# Patient Record
Sex: Male | Born: 1945 | Race: White | Hispanic: No | Marital: Married | Smoking: Never smoker
Health system: Southern US, Community
[De-identification: ages and names within clinical notes are randomized; demographics above are authoritative.]

## PROBLEM LIST (undated history)

## (undated) DIAGNOSIS — K5790 Diverticulosis of intestine, part unspecified, without perforation or abscess without bleeding: Secondary | ICD-10-CM

## (undated) DIAGNOSIS — J449 Chronic obstructive pulmonary disease, unspecified: Secondary | ICD-10-CM

## (undated) DIAGNOSIS — I10 Essential (primary) hypertension: Secondary | ICD-10-CM

## (undated) DIAGNOSIS — E785 Hyperlipidemia, unspecified: Secondary | ICD-10-CM

## (undated) DIAGNOSIS — K219 Gastro-esophageal reflux disease without esophagitis: Secondary | ICD-10-CM

## (undated) DIAGNOSIS — F419 Anxiety disorder, unspecified: Secondary | ICD-10-CM

## (undated) DIAGNOSIS — F329 Major depressive disorder, single episode, unspecified: Secondary | ICD-10-CM

## (undated) DIAGNOSIS — K2270 Barrett's esophagus without dysplasia: Secondary | ICD-10-CM

## (undated) DIAGNOSIS — F32A Depression, unspecified: Secondary | ICD-10-CM

## (undated) HISTORY — DX: Major depressive disorder, single episode, unspecified: F32.9

## (undated) HISTORY — DX: Gastro-esophageal reflux disease without esophagitis: K21.9

## (undated) HISTORY — DX: Essential (primary) hypertension: I10

## (undated) HISTORY — DX: Anxiety disorder, unspecified: F41.9

## (undated) HISTORY — DX: Depression, unspecified: F32.A

## (undated) HISTORY — DX: Hyperlipidemia, unspecified: E78.5

## (undated) HISTORY — DX: Chronic obstructive pulmonary disease, unspecified: J44.9

## (undated) HISTORY — DX: Barrett's esophagus without dysplasia: K22.70

## (undated) HISTORY — DX: Diverticulosis of intestine, part unspecified, without perforation or abscess without bleeding: K57.90

---

## 1983-06-15 HISTORY — PX: NASAL SINUS SURGERY: SHX719

## 1986-06-14 HISTORY — PX: LUMBAR LAMINECTOMY: SHX95

## 1996-06-14 HISTORY — PX: KNEE ARTHROSCOPY: SUR90

## 1998-10-05 ENCOUNTER — Emergency Department (HOSPITAL_COMMUNITY): Admission: EM | Admit: 1998-10-05 | Discharge: 1998-10-05 | Payer: Self-pay | Admitting: Emergency Medicine

## 1999-03-17 ENCOUNTER — Other Ambulatory Visit: Admission: RE | Admit: 1999-03-17 | Discharge: 1999-03-17 | Payer: Self-pay | Admitting: Internal Medicine

## 2001-02-06 ENCOUNTER — Encounter
Admission: RE | Admit: 2001-02-06 | Discharge: 2001-02-06 | Payer: Self-pay | Admitting: Physical Medicine and Rehabilitation

## 2001-02-06 ENCOUNTER — Encounter: Payer: Self-pay | Admitting: Physical Medicine and Rehabilitation

## 2001-03-07 ENCOUNTER — Other Ambulatory Visit: Admission: RE | Admit: 2001-03-07 | Discharge: 2001-03-07 | Payer: Self-pay | Admitting: Urology

## 2001-04-27 ENCOUNTER — Encounter: Admission: RE | Admit: 2001-04-27 | Discharge: 2001-05-19 | Payer: Self-pay | Admitting: Orthopaedic Surgery

## 2001-06-12 ENCOUNTER — Encounter: Payer: Self-pay | Admitting: Orthopaedic Surgery

## 2001-06-14 HISTORY — PX: OTHER SURGICAL HISTORY: SHX169

## 2001-06-20 ENCOUNTER — Inpatient Hospital Stay (HOSPITAL_COMMUNITY): Admission: RE | Admit: 2001-06-20 | Discharge: 2001-06-25 | Payer: Self-pay | Admitting: Orthopaedic Surgery

## 2001-06-20 ENCOUNTER — Encounter: Payer: Self-pay | Admitting: Orthopaedic Surgery

## 2001-06-24 ENCOUNTER — Encounter: Payer: Self-pay | Admitting: Orthopaedic Surgery

## 2001-11-17 ENCOUNTER — Encounter: Admission: RE | Admit: 2001-11-17 | Discharge: 2002-02-15 | Payer: Self-pay

## 2002-01-09 ENCOUNTER — Emergency Department (HOSPITAL_COMMUNITY): Admission: EM | Admit: 2002-01-09 | Discharge: 2002-01-09 | Payer: Self-pay | Admitting: Emergency Medicine

## 2002-02-16 ENCOUNTER — Encounter: Admission: RE | Admit: 2002-02-16 | Discharge: 2002-03-13 | Payer: Self-pay

## 2002-03-15 ENCOUNTER — Encounter: Admission: RE | Admit: 2002-03-15 | Discharge: 2002-06-13 | Payer: Self-pay

## 2002-08-20 ENCOUNTER — Encounter: Admission: RE | Admit: 2002-08-20 | Discharge: 2002-11-18 | Payer: Self-pay

## 2003-05-01 ENCOUNTER — Emergency Department (HOSPITAL_COMMUNITY): Admission: EM | Admit: 2003-05-01 | Discharge: 2003-05-01 | Payer: Self-pay | Admitting: *Deleted

## 2004-08-18 ENCOUNTER — Ambulatory Visit: Payer: Self-pay | Admitting: Internal Medicine

## 2004-08-19 ENCOUNTER — Ambulatory Visit: Payer: Self-pay | Admitting: Internal Medicine

## 2004-10-21 ENCOUNTER — Ambulatory Visit (HOSPITAL_COMMUNITY): Payer: Self-pay | Admitting: Psychiatry

## 2004-11-23 ENCOUNTER — Ambulatory Visit (HOSPITAL_COMMUNITY): Payer: Self-pay | Admitting: Psychiatry

## 2004-11-23 ENCOUNTER — Ambulatory Visit: Payer: Self-pay | Admitting: Psychiatry

## 2005-01-04 ENCOUNTER — Ambulatory Visit (HOSPITAL_COMMUNITY): Payer: Self-pay | Admitting: Psychiatry

## 2005-01-27 ENCOUNTER — Ambulatory Visit (HOSPITAL_COMMUNITY): Payer: Self-pay | Admitting: Psychiatry

## 2005-02-08 ENCOUNTER — Ambulatory Visit (HOSPITAL_COMMUNITY): Payer: Self-pay | Admitting: Psychiatry

## 2006-03-30 ENCOUNTER — Encounter: Admission: RE | Admit: 2006-03-30 | Discharge: 2006-03-30 | Payer: Self-pay | Admitting: Family Medicine

## 2006-08-19 ENCOUNTER — Ambulatory Visit: Payer: Self-pay | Admitting: Oncology

## 2009-02-26 ENCOUNTER — Encounter (INDEPENDENT_AMBULATORY_CARE_PROVIDER_SITE_OTHER): Payer: Self-pay | Admitting: *Deleted

## 2009-10-31 ENCOUNTER — Telehealth: Payer: Self-pay | Admitting: Internal Medicine

## 2010-07-14 NOTE — Progress Notes (Signed)
Summary: Schedule Colonoscopy  Phone Note Outgoing Call Call back at Home Phone [phone]   Call placed by: Harlow Mares CMA Duncan Dull),  Oct 31, 2009 3:29 PM Call placed to: Patient Summary of Call: spoke to the patient and he just had prostate surgery and he would like someone to mail him a letter in a couple of months to remind him to call back to schedule. we wil mail the pt a reminder letter Initial call taken by: Harlow Mares CMA Duncan Dull),  Oct 31, 2009 3:38 PM

## 2011-11-25 ENCOUNTER — Encounter: Payer: Self-pay | Admitting: Internal Medicine

## 2011-12-24 ENCOUNTER — Encounter: Payer: Self-pay | Admitting: Internal Medicine

## 2011-12-31 ENCOUNTER — Encounter: Payer: Self-pay | Admitting: Internal Medicine

## 2011-12-31 ENCOUNTER — Ambulatory Visit (AMBULATORY_SURGERY_CENTER): Payer: Medicare HMO | Admitting: *Deleted

## 2011-12-31 VITALS — Ht 68.0 in | Wt 208.8 lb

## 2011-12-31 DIAGNOSIS — Z1211 Encounter for screening for malignant neoplasm of colon: Secondary | ICD-10-CM

## 2011-12-31 MED ORDER — MOVIPREP 100 G PO SOLR: ORAL | Status: DC

## 2011-12-31 NOTE — Progress Notes (Signed)
Uses oxygen 2 liters at bedtime

## 2012-01-12 ENCOUNTER — Ambulatory Visit (AMBULATORY_SURGERY_CENTER): Payer: Medicare HMO | Admitting: Internal Medicine

## 2012-01-12 ENCOUNTER — Encounter: Payer: Self-pay | Admitting: Internal Medicine

## 2012-01-12 VITALS — BP 141/83 | HR 67 | Temp 97.0°F | Resp 15 | Ht 68.0 in | Wt 208.0 lb

## 2012-01-12 DIAGNOSIS — Z1211 Encounter for screening for malignant neoplasm of colon: Secondary | ICD-10-CM

## 2012-01-12 MED ORDER — SODIUM CHLORIDE 0.9 % IV SOLN: 500.0000 mL | INTRAVENOUS | Status: DC

## 2012-01-12 NOTE — Patient Instructions (Addendum)
YOU HAD AN ENDOSCOPIC PROCEDURE TODAY AT THE [facility]: Refer to the procedure report that was given to you for any specific questions about what was found during the examination.  If the procedure report does not answer your questions, please call your gastroenterologist to clarify.  If you requested that your care partner not be given the details of your procedure findings, then the procedure report has been included in a sealed envelope for you to review at your convenience later.  YOU SHOULD EXPECT: Some feelings of bloating in the abdomen. Passage of more gas than usual.  Walking can help get rid of the air that was put into your GI tract during the procedure and reduce the bloating. If you had a lower endoscopy (such as a colonoscopy or flexible sigmoidoscopy) you may notice spotting of blood in your stool or on the toilet paper. If you underwent a bowel prep for your procedure, then you may not have a normal bowel movement for a few days.  DIET: Your first meal following the procedure should be a light meal and then it is ok to progress to your normal diet.  A half-sandwich or bowl of soup is an example of a good first meal.  Heavy or fried foods are harder to digest and may make you feel nauseous or bloated.  Likewise meals heavy in dairy and vegetables can cause extra gas to form and this can also increase the bloating.  Drink plenty of fluids but you should avoid alcoholic beverages for 24 hours.  ACTIVITY: Your care partner should take you home directly after the procedure.  You should plan to take it easy, moving slowly for the rest of the day.  You can resume normal activity the day after the procedure however you should NOT DRIVE or use heavy machinery for 24 hours (because of the sedation medicines used during the test).    SYMPTOMS TO REPORT IMMEDIATELY: A gastroenterologist can be reached at any hour.  During normal business hours, 8:30 AM to 5:00 PM Monday through Friday,  call [phone].  After hours and on weekends, please call the GI answering service at [phone] who will take a message and have the physician on call contact you.   Following lower endoscopy (colonoscopy or flexible sigmoidoscopy):  Excessive amounts of blood in the stool  Significant tenderness or worsening of abdominal pains  Swelling of the abdomen that is new, acute  Fever of 100F or higher  FOLLOW UP:.  Our staff will call the home number listed on your records the next business day following your procedure to check on you and address any questions or concerns that you may have at that time regarding the information given to you following your procedure. This is a courtesy call and so if there is no answer at the home number and we have not heard from you through the emergency physician on call, we will assume that you have returned to your regular daily activities without incident.  SIGNATURES/CONFIDENTIALITY: You and/or your care partner have signed paperwork which will be entered into your electronic medical record.  These signatures attest to the fact that that the information above on your After Visit Summary has been reviewed and is understood.  Full responsibility of the confidentiality of this discharge information lies with you and/or your care-partner.   Resume your normal medications  Follow up colonoscopy in 10 years

## 2012-01-12 NOTE — Progress Notes (Signed)
Patient did not experience any of the following events: a burn prior to discharge; a fall within the facility; wrong site/side/patient/procedure/implant event; or a hospital transfer or hospital admission upon discharge from the facility. (G8907) Patient did not have preoperative order for IV antibiotic SSI prophylaxis. (G8918)  

## 2012-01-12 NOTE — Op Note (Signed)
[facility] [address] [place], [place]  [address]  COLONOSCOPY PROCEDURE REPORT  PATIENT:  Don, Garcia  MR#:  [id number] BIRTHDATE:  19-Oct-1945, 66 yrs. old  GENDER:  male ENDOSCOPIST:  Hedwig Morton. Juanda Chance, MD REF. BY:  Burnell Blanks, M.D. PROCEDURE DATE:  01/12/2012 PROCEDURE:  Colonoscopy 19147 ASA CLASS:  Class II INDICATIONS:  colorectal cancer screening, average risk last colon 03/1999 Polyp- lymphoid aggregate MEDICATIONS:   MAC sedation, administered by CRNA, propofol (Diprivan) 80 mg  DESCRIPTION OF PROCEDURE:   After the risks and benefits and of the procedure were explained, informed consent was obtained. Digital rectal exam was performed and revealed no rectal masses. The LB CF-[id number] endoscope was introduced through the anus and advanced to the cecum, which was identified by both the appendix and ileocecal valve.  The quality of the prep was good, using MoviPrep.  The instrument was then slowly withdrawn as the colon was fully examined. <<PROCEDUREIMAGES>>  FINDINGS:  Scattered diverticula were found (see image1 and image2).  This was otherwise a normal examination of the colon (see image5, image4, and image3).   Retroflexed views in the rectum revealed no abnormalities.    The scope was then withdrawn from the patient and the procedure completed.  COMPLICATIONS:  None ENDOSCOPIC IMPRESSION: 1) Diverticula, scattered 2) Otherwise normal examination RECOMMENDATIONS: 1) High fiber diet.  REPEAT EXAM:  In 10 year(s) for.  ______________________________ Hedwig Morton. Juanda Chance, MD  CC:  n. eSIGNED:   Hedwig Morton. Tyran Huser at 01/12/2012 02:24 PM  Felix Pacini, [phone]

## 2012-01-13 ENCOUNTER — Telehealth: Payer: Self-pay

## 2012-01-13 NOTE — Telephone Encounter (Signed)
  Follow up Call-  Call back number 01/12/2012  Post procedure Call Back phone  # [phone]  Permission to leave phone message Yes     Patient questions:  Do you have a fever, pain , or abdominal swelling? no Pain Score  0 *  Have you tolerated food without any problems? yes  Have you been able to return to your normal activities? yes  Do you have any questions about your discharge instructions: Diet   no Medications  no Follow up visit  no  Do you have questions or concerns about your Care? no  Actions: * If pain score is 4 or above: No action needed, pain <4.

## 2013-02-16 ENCOUNTER — Encounter: Payer: Self-pay | Admitting: Internal Medicine

## 2013-02-16 ENCOUNTER — Ambulatory Visit (INDEPENDENT_AMBULATORY_CARE_PROVIDER_SITE_OTHER): Payer: Medicare Other | Admitting: Internal Medicine

## 2013-02-16 VITALS — BP 142/94 | HR 103 | Temp 98.0°F | Ht 68.0 in | Wt 216.0 lb

## 2013-02-16 DIAGNOSIS — Z23 Encounter for immunization: Secondary | ICD-10-CM

## 2013-02-16 DIAGNOSIS — R0602 Shortness of breath: Secondary | ICD-10-CM | POA: Insufficient documentation

## 2013-02-16 DIAGNOSIS — R000 Tachycardia, unspecified: Secondary | ICD-10-CM | POA: Insufficient documentation

## 2013-02-16 NOTE — Patient Instructions (Addendum)
No changes in medications for now  Please schedule a follow up office visit in 4 weeks, sooner if needed with pfts  Late add : use bud with duoneb at least twice daily and bring neb solutions back Late add : did not go for cxr or labs will do on return since symptoms present for years
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
## 2013-02-17 NOTE — Assessment & Plan Note (Addendum)
-   02/16/2013  Walked RA x 2 laps @ 185 ft each stopped due to sob and sat around 88% but pulse reading was erratic and pt did not appear tachypneic  Symptoms are markedly disproportionate to objective findings and not clear this is a lung problem but pt does appear to have difficult airway management issues. DDX of  difficult airways managment all start with A and  include Adherence, Ace Inhibitors, Acid Reflux, Active Sinus Disease, Alpha 1 Antitripsin deficiency, Anxiety masquerading as Airways dz,  ABPA,  allergy(esp in young), Aspiration (esp in elderly), Adverse effects of DPI,  Active smokers, plus two Bs  = Bronchiectasis and Beta blocker use..and one C= CHF  ? Acid reflux related > difficult to exclude due to contribution of non-acid gerd > max rx x one month rec  Anxiety > typically a dx of exclusion but much higher on ddx based on hx and exam  ? Allergies/ asthma > continue bud bid with duoneb for now pending f/u with pfts  ? chf unlikely s peripheral edema/ orthopnea or worse when walking than at rest.

## 2013-02-20 ENCOUNTER — Telehealth: Payer: Self-pay | Admitting: Internal Medicine

## 2013-02-20 NOTE — Telephone Encounter (Signed)
Spoke with pt  He states that MW had asked him at ov last wk if he coughed up any mucus, and he said no, but now he is calling to say that he does cough up mucus He states that this has been going on for a long time, and he is unsure if mucus has any color to it He also wants MW to know that since meds adjusted he is doing much better with his breathing I advised that I will let MW know, and if he has further recs I will call him back  Pt encouraged to keep ov with PFT for 03/20/13

## 2013-02-26 ENCOUNTER — Telehealth: Payer: Self-pay | Admitting: Internal Medicine

## 2013-02-26 NOTE — Telephone Encounter (Signed)
There is no point in trying to work this out over the phone as we did not accomplish anything at the last ov for the same problem, namely doing accurate med rec   Tell pt: see Tammy NP w/in 1 weeks with all your medications, even over the counter meds, separated in two separate bags, the ones you take no matter what vs the ones you stop once you feel better and take only as needed when you feel you need them.   Tammy  will generate for you a new user friendly medication calendar that will put [place] all on the same page re: your medication use.     Without this process, it simply isn't possible to assure that we are providing  your outpatient care  with  the attention to detail we feel you deserve.   If we cannot assure that you're getting that kind of care,  then we cannot manage your problem effectively from this clinic.  Once you have seen Tammy and we are sure that we're all on the same page with your medication use she will arrange follow up with me.  Tammy can do labs and xrays also

## 2013-02-26 NOTE — Telephone Encounter (Signed)
Spoke with patient-- Patient states he is SOB but no more SOB than when he was at last OV 02/16/13 Patient says his nebulizers are not working anymore, When asked which nebulizers he is currently taking he states budesonide in which he is taking BID Also stated he is taking another one that starts with a "L" (xopenex?) states he is taking this BID as well right after the budesonide According to last OV: Patient Instructions    No changes in medications for now  Please schedule a follow up office visit in 4 weeks, sooner if needed with pfts  Late add : use bud with duoneb at least twice daily and bring neb solutions back  Late add : did not go for cxr or labs will do on return since symptoms present for years   I do no see Duoneb on patients med list nor did patient mention this. I asked patient does he feel he needs to come in and be seen sooner than his scheduled f/u Per patient "I been breathing like this most of my life" Patient denies any chest tightness or wheezing Requesting recs per Dr. Sherene Sires-- please advise, thank you!  Last OV 02/16/13 Next OV 03/20/13 with PFTs same day  Allergies  Allergen Reactions  . Celebrex [Celecoxib]     lethargy  . Vioxx [Rofecoxib]     lethargy

## 2013-02-26 NOTE — Telephone Encounter (Signed)
Called, spoke with pt - Explained MW's recs to him.  Offered OV tomorrow with TP in [place].  Pt declined d/t [place] location.  Next available with TP for med cal is Monday, Sept 22.  Pt ok with this appt.  He is aware to arrive at 10:45 am and to bring ALL meds he is currently taking (OTC, rx, prn, vitamins, etc) to OV.  Advised med list would NOT work.  Pt verbalized understanding and voiced no further questions or concerns at this time.  ** Pt did clarify below med per conversation with Lauren and states the med that started with a "L" is levalbuterol.

## 2013-02-28 MED ORDER — AMOXICILLIN-POT CLAVULANATE 875-125 MG PO TABS: 1.0000 | ORAL_TABLET | Freq: Two times a day (BID) | ORAL | Status: DC

## 2013-02-28 NOTE — Telephone Encounter (Signed)
Pt calling again in ref to previous msg can be reached at [phone].Don Garcia

## 2013-02-28 NOTE — Telephone Encounter (Signed)
augmentin 875 bid x 10 days > then sinus ct if fails to clear it

## 2013-02-28 NOTE — Telephone Encounter (Signed)
Called, spoke with pt.  Informed him of below recs per Dr. Sherene Sires.  He is aware abx sent to [facility].  He is to call back if symptoms do not improve or worsen.  Pt has a pending Med Cal OV scheduled with TP on 03/05/13.  Encouraged pt to keep this appt and reminded him to bring all meds with him to OV.  He verbalized understanding and voiced no further questions or concerns at this time.

## 2013-02-28 NOTE — Telephone Encounter (Signed)
Called # listed to call in message and was advised they do not know anyone by that name. Confirmed #. I called # listed above.  I spoke with pt. He stated he is coughing up yellow-green phlem now. He is wheezing but no chest tx. Please advise Dr. Sherene Sires thanks

## 2013-03-05 ENCOUNTER — Encounter: Payer: Medicare Other | Admitting: Adult Health

## 2013-03-07 ENCOUNTER — Telehealth: Payer: Self-pay | Admitting: Internal Medicine

## 2013-03-07 NOTE — Telephone Encounter (Signed)
I spoke with pt. He reports since doing the nebulizer treatments as MW advised her has been feeling hoarse since the weekend. Hoarseness comes and goes. He is not due back until 03/20/13. He is wanting to know if the nebulizer are causing this or if he should do anything different. He is fine with a call back in the AM when MW comes into the office. Please advise MW thanks  Allergies  Allergen Reactions  . Celebrex [Celecoxib]     lethargy  . Vioxx [Rofecoxib]     lethargy

## 2013-03-07 NOTE — Telephone Encounter (Signed)
Called spoke with patient, advised of recs as stated by MW below.  Pt verbalized his understanding and will continue recommendations as discussed at last ov and call for sooner follow up if breathing worsens.  Nothing further needed at this time; will sign off.

## 2013-03-07 NOTE — Telephone Encounter (Signed)
If it's just the hoarseness would not change rx, but if breathing gets worse at all needs to move up ov

## 2013-03-20 ENCOUNTER — Encounter: Payer: Self-pay | Admitting: Internal Medicine

## 2013-03-20 ENCOUNTER — Ambulatory Visit (INDEPENDENT_AMBULATORY_CARE_PROVIDER_SITE_OTHER): Payer: Medicare Other | Admitting: Internal Medicine

## 2013-03-20 ENCOUNTER — Ambulatory Visit (INDEPENDENT_AMBULATORY_CARE_PROVIDER_SITE_OTHER)
Admission: RE | Admit: 2013-03-20 | Discharge: 2013-03-20 | Disposition: A | Discharge status: Home / Self Care | Payer: Medicare Other | Source: Ambulatory Visit | Attending: Internal Medicine | Admitting: Internal Medicine

## 2013-03-20 ENCOUNTER — Other Ambulatory Visit (INDEPENDENT_AMBULATORY_CARE_PROVIDER_SITE_OTHER): Payer: Medicare Other

## 2013-03-20 VITALS — BP 122/80 | HR 87 | Temp 98.0°F | Ht 68.0 in | Wt 213.0 lb

## 2013-03-20 DIAGNOSIS — R0602 Shortness of breath: Secondary | ICD-10-CM

## 2013-03-20 DIAGNOSIS — J449 Chronic obstructive pulmonary disease, unspecified: Secondary | ICD-10-CM

## 2013-03-20 DIAGNOSIS — R000 Tachycardia, unspecified: Secondary | ICD-10-CM

## 2013-03-20 LAB — CBC WITH DIFFERENTIAL/PLATELET
Basophils Relative: 0.5 % (ref 0.0–3.0)
Eosinophils Absolute: 0.1 10*3/uL (ref 0.0–0.7)
Hemoglobin: 14.1 g/dL (ref 13.0–17.0)
Lymphocytes Relative: 18.3 % (ref 12.0–46.0)
MCHC: 34.3 g/dL (ref 30.0–36.0)
MCV: 86.9 fl (ref 78.0–100.0)
Neutro Abs: 5.6 10*3/uL (ref 1.4–7.7)
Neutrophils Relative %: 72 % (ref 43.0–77.0)
RBC: 4.72 Mil/uL (ref 4.22–5.81)
RDW: 13.4 % (ref 11.5–14.6)

## 2013-03-20 LAB — BASIC METABOLIC PANEL
CO2: 28 mEq/L (ref 19–32)
GFR: 72.06 mL/min (ref >60.00)
Glucose, Bld: 110 mg/dL — ABNORMAL HIGH (ref 70–99)
Potassium: 4.1 mEq/L (ref 3.5–5.1)
Sodium: 141 mEq/L (ref 135–145)

## 2013-03-20 LAB — D-DIMER, QUANTITATIVE (NOT AT ARMC): D-Dimer, Quant: 0.27 ug/mL-FEU (ref 0.00–0.48)

## 2013-03-20 LAB — TSH: TSH: 2.5 u[IU]/mL (ref 0.35–5.50)
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
## 2013-03-20 NOTE — Progress Notes (Signed)
Quick Note:  Spoke with pt and notified of results per Dr. Wert. Pt verbalized understanding and denied any questions.  ______ 

## 2013-03-20 NOTE — Assessment & Plan Note (Signed)
-   02/16/2013  Walked RA x 2 laps @ 185 ft each stopped due to sob and sat around 88% but pulse reading was erratic and pt did not appear tachypneic - 03/20/2013   PFTs FEV1  3.29 (103%) ratio 81 and no change p B2  DLC0 66% and corrects to 92% - 03/20/2013  Walked RA x 3 laps @ 185 ft each stopped due to  End of study, no sob  Pt not convinced the meds are making any difference in his symptoms so it's fine with me to try reducing them and observing for worse symptoms but needs to be more insightful about those specific symptoms related to ? Asthma  and an action plan for prns in event those symptoms worsen as taper meds that may or may not be helping.  This is the reverse of a therapeutic trial and it a way more difficult for pts to follow than adding meds to list.  "stopping meds to see if it hurts"' rather than "starting meds to see if helps"  > stop all resp meds and see  Next step would be cpst with spirometry before and after but strongly suspect this is all anxiety/ obesity/ deconditioning.

## 2013-03-20 NOTE — Patient Instructions (Addendum)
You do not have significant copd and never will as long as you don't smoke.  Weight control is simply a matter of calorie balance which needs to be tilted in your favor by eating less and exercising more.  To get the most out of exercise, you need to be continuously aware that you are short of breath, but never out of breath, for 30 minutes daily. As you improve, it will actually be easier for you to do the same amount of exercise  in  30 minutes so always push to the level where you are short of breath.  If this does not result in gradual weight reduction then I strongly recommend you see a nutritionist with a food diary x 2 weeks so that we can work out a negative calorie balance which is universally effective in steady weight loss programs.  Think of your calorie balance like you do your bank account where in this case you want the balance to go down so you must take in less calories than you burn up.  It's just that simple:  Hard to do, but easy to understand.  Good luck!   Please remember to go to the lab and x-ray department downstairs for your tests - we will call you with the results when they are available.

## 2013-03-20 NOTE — Progress Notes (Signed)
PFT done today. 

## 2013-03-22 ENCOUNTER — Telehealth: Payer: Self-pay | Admitting: Internal Medicine

## 2013-03-22 DIAGNOSIS — R0602 Shortness of breath: Secondary | ICD-10-CM

## 2013-03-22 NOTE — Telephone Encounter (Signed)
Pt seen on 03-20-13.  SOB is no better since that visit.  PT denies wheezing, chest tightness, cough or fever.  Pt uses oxygen at hs only.  Pt has not used nebulizer since ov and sob is still the same.  Pt states that if he doesn't have COPD then what does he have and what does he need to do from here.  Please advise

## 2013-03-22 NOTE — Telephone Encounter (Signed)
PT informed of Dr Thurston Hole recommendations.HE verified understanding.   CPST order sent

## 2013-03-22 NOTE — Telephone Encounter (Signed)
Since breathing no worse with exertion rec walk 30 min daily at pace that he can tolerate  Schedule for 2 weeks from now a cpst with spirometry before and after and I'll call him with results

## 2013-03-26 ENCOUNTER — Telehealth: Payer: Self-pay | Admitting: Internal Medicine

## 2013-03-26 NOTE — Telephone Encounter (Signed)
I spoke with pt. He stated he is not going to walk daily bc he has a "walking problem". He will do what he can. This is FYI for [place].

## 2013-03-28 ENCOUNTER — Telehealth: Payer: Self-pay | Admitting: Internal Medicine

## 2013-03-28 NOTE — Telephone Encounter (Signed)
I spoke with pt. Documented on earlier phone note

## 2013-03-28 NOTE — Telephone Encounter (Signed)
Pt also called back to tell us he stopped all his medications prescribed by [place]. He stated he doesn't have COPD so he does not feel he needs them. He stopped the medications last week. He also reports it doesn't help with his breathing. He is not going to restart them either. Will forward to Dr. Sherene Sires

## 2013-03-28 NOTE — Telephone Encounter (Signed)
I spoke with pt. He stated he does not have good balance and does not walk good. He stated he is not sure if he is able to do the CPX d/t to this that is scheduled for 04/03/13. He reports since COPD was ruled out he does not know why he needs to have this done anyways. Please advise MW pt does not really want test done thanks

## 2013-03-29 ENCOUNTER — Telehealth: Payer: Self-pay | Admitting: Internal Medicine

## 2013-03-29 NOTE — Telephone Encounter (Signed)
Pt is already scheduled for CPST. He is stating he does not feel he can do this. He is wanting to cancel. Please advise thanks

## 2013-03-29 NOTE — Telephone Encounter (Signed)
Fine to cancel, nothing else to offer, he should return to primary care to regroup

## 2013-03-29 NOTE — Telephone Encounter (Signed)
Pt called back and is wanting to make sure it is okay for him to cancel the test. Please advise Dr. Sherene Sires thanks

## 2013-03-29 NOTE — Telephone Encounter (Signed)
Schedule cpst with spirometry before and after, next available

## 2013-03-29 NOTE — Telephone Encounter (Signed)
I spoke with pt and advised him we are awaiting MW response.

## 2013-03-29 NOTE — Telephone Encounter (Signed)
Duplicate message. 

## 2013-03-29 NOTE — Telephone Encounter (Signed)
I spoke with pt. Made him aware. Test has been cancelled. He will call to see his PCP for future.

## 2013-03-29 NOTE — Telephone Encounter (Signed)
Pt called back & asks to speak w/ MW's nurse.  Antionette Fairy

## 2013-04-02 ENCOUNTER — Telehealth: Payer: Self-pay | Admitting: Internal Medicine

## 2013-04-02 NOTE — Telephone Encounter (Signed)
I spoke with pt. He is requesting an appt to see MW instead. He is scheduled to come in at 8:45. Nothing further needed

## 2013-04-02 NOTE — Telephone Encounter (Signed)
Fine to reschedule cpst

## 2013-04-02 NOTE — Telephone Encounter (Signed)
Called, spoke with pt.  C/o SOB at rest and with exertion x "years."  States this is unchanged since last OV with MW.  Also has wheezing, some chest tightness, and coughing with green mucus at times.  No f/c/s.  Pt wanting to know why CPST was cancelled.  According to phone msg from 03/28/13, pt called requesting it be cancelled because he didn't feel he could do it d/t not being able to walk good and not having good balance. MW ok'ed it to be cancelled and rec pt return to PCP to regroup.  I reminded pt of this.  He verbalized understanding.  Pt states he would now like to try to proceed with the CPST.  Dr. Sherene Sires, are you ok with this now?  Please advise.  Thank you.  Note:  CPST was ordered for:  Needs CPST with spirometry before and after

## 2013-04-03 ENCOUNTER — Ambulatory Visit (INDEPENDENT_AMBULATORY_CARE_PROVIDER_SITE_OTHER): Payer: Medicare Other | Admitting: Internal Medicine

## 2013-04-03 ENCOUNTER — Encounter: Payer: Self-pay | Admitting: Internal Medicine

## 2013-04-03 ENCOUNTER — Encounter (HOSPITAL_COMMUNITY): Payer: Medicare Other

## 2013-04-03 VITALS — BP 144/90 | HR 90 | Temp 98.0°F | Ht 68.0 in | Wt 220.0 lb

## 2013-04-03 DIAGNOSIS — R0602 Shortness of breath: Secondary | ICD-10-CM
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
## 2013-04-03 NOTE — Patient Instructions (Addendum)
Weight control is simply a matter of calorie balance which needs to be tilted in your favor by eating less and exercising more.  To get the most out of exercise, you need to be continuously aware that you are short of breath, but never out of breath, for 30 minutes daily. As you improve, it will actually be easier for you to do the same amount of exercise  in  30 minutes so always push to the level where you are short of breath.  If this does not result in gradual weight reduction then I strongly recommend you see a nutritionist with a food diary x 2 weeks so that we can work out a negative calorie balance which is universally effective in steady weight loss programs.  Think of your calorie balance like you do your bank account where in this case you want the balance to go down so you must take in less calories than you burn up.  It's just that simple:  Hard to do, but easy to understand.  Good luck!   The next step in the work up is to do cpst (stress test) but there's no reason to attempt this unless you walk up to at least 20 min without stopping (if you need to walk slowly at first that's fine)  I suggest you return to Dr Nathanial Rancher and let her decide whether further specialty care or CPST are warranted.  No regular pulmonary follow up needed

## 2013-04-03 NOTE — Assessment & Plan Note (Signed)
-   02/16/2013  Walked RA x 2 laps @ 185 ft each stopped due to sob and sat around 88% but pulse reading was erratic and pt did not appear tachypneic - 03/20/2013   PFTs FEV1  3.29 (103%) ratio 81 and no change p B2  DLC0 66% and corrects to 92% - 03/20/2013  Walked RA x 3 laps @ 185 ft each stopped due to  End of study, no sob - 04/03/2013  Walked RA x 1 laps @ 185 ft each stopped due to sob s desat      I had an extended summary  discussion with the patient today lasting 15 to 20 minutes of a 25 minute visit on the following issues:  All indicators point to progressive wt gain/ deconditioning as cause of sob with ex and sigh breathing at rest c/w anxiety disorder.  He could not possibly benefit from cpst at this point because he's not walking long enough for eval and needs to start reconditioning ex today.  Referred back to Dr [facility] with f/u here prn

## 2013-04-04 ENCOUNTER — Telehealth: Payer: Self-pay | Admitting: Internal Medicine

## 2013-04-04 NOTE — Telephone Encounter (Signed)
I spoke with pt. He stated he still feels SOB. I advised him that MW stated to follow up with his PCP for further eval. I advised MW has did what he could for him since he had refused to do CPST. Pt then stopped me and stated "I know he has and he ain't worth a Sh**". Pt then hung the phone up on me. I will forward to MW as an Lorain Childes pt called and was rude.

## 2014-04-29 ENCOUNTER — Institutional Professional Consult (permissible substitution): Payer: Medicare Other | Admitting: Internal Medicine

## 2014-05-13 ENCOUNTER — Encounter: Payer: Self-pay | Admitting: Internal Medicine

## 2014-05-28 ENCOUNTER — Ambulatory Visit (HOSPITAL_BASED_OUTPATIENT_CLINIC_OR_DEPARTMENT_OTHER): Payer: Medicare Other | Attending: Allergy and Immunology | Admitting: Radiology

## 2014-05-28 VITALS — Ht 68.0 in | Wt 215.0 lb

## 2014-05-28 DIAGNOSIS — R0683 Snoring: Secondary | ICD-10-CM

## 2014-05-28 DIAGNOSIS — G4734 Idiopathic sleep related nonobstructive alveolar hypoventilation: Secondary | ICD-10-CM | POA: Insufficient documentation

## 2014-06-02 DIAGNOSIS — G4734 Idiopathic sleep related nonobstructive alveolar hypoventilation: Secondary | ICD-10-CM

## 2014-06-02 DIAGNOSIS — R0683 Snoring: Secondary | ICD-10-CM

## 2014-06-02 NOTE — Sleep Study (Signed)
   NAME: Don BarrierMichael L Yanda DATE OF BIRTH:  [birth year] MEDICAL RECORD NUMBER [id number]  LOCATION: [facility]  PHYSICIAN: Ramonica Grigg D  DATE OF STUDY: 05/28/2014  SLEEP STUDY TYPE: Nocturnal Polysomnogram               REFERRING PHYSICIAN: Jessica PriestKozlow, Eric J, MD  INDICATION FOR STUDY: Hypersomnia with sleep apnea  EPWORTH SLEEPINESS SCORE:   2/24 HEIGHT: 5\' 8"  (172.7 cm)  WEIGHT: 215 lb (97.523 kg)    Body mass index is 32.7 kg/(m^2).  NECK SIZE: 16 in.  MEDICATIONS: Charted for review  SLEEP ARCHITECTURE: Split study protocol. During the diagnostic phase, total sleep time 130.5 minutes with sleep efficiency 65.3%. Stage I was 25.3%, stage II 70.9%, stage III absent, REM 3.8% of total sleep time. Sleep latency 21.5 minutes, REM latency 69.5 minutes, awake after sleep onset 45 minutes, arousal index 28, bedtime medication: Sertraline, amlodipine, lorazepam, lovastatin  RESPIRATORY DATA: Apnea hypopnea index (AHI) 26.2 per hour. 57 total events scored including 17 obstructive apneas and 40 hypopneas. Most events were while nonsupine. REM AHI 0. CPAP titration to 11 CWP, AHI 0 per hour. He wore a medium fullface mask.  OXYGEN DATA: Very loud snoring before CPAP with oxygen desaturation to a nadir of 85% on room air. With CPAP control snoring was prevented and mean oxygen saturation was 91.2%.  CARDIAC DATA: Sinus rhythm with PACs and PVCs  MOVEMENT/PARASOMNIA: During the diagnostic phase total of 90 limb jerks were counted of which 6 were associated with arousal or awakening for an index of 2.8 per hour. During the titration phase total of 120 limb jerks were counted of which 2 were associated with arousal or awakening for a periodic limb movement with arousal index of 0.7 per hour. Bathroom 1  IMPRESSION/ RECOMMENDATION:   1) Moderate obstructive sleep apnea/hypopnea syndrome, AHI 26.2 per hour, REM AHI 0 per hour. Very loud snoring with oxygen desaturation to a  nadir of 85% on room air. 2) Successful CPAP titration to 11 CWP, AHI 0 per hour. He wore a medium [employer] Simplus fullface mask with heated humidifier. Snoring was prevented and mean oxygen saturation was 91.2%.   Waymon BudgeYOUNG,Avontae Burkhead D Diplomate, American Board of Sleep Medicine  ELECTRONICALLY SIGNED ON:  06/02/2014, 9:34 AM [facility] PH: [phone]   FX: [phone] ACCREDITED BY THE AMERICAN ACADEMY OF SLEEP MEDICINE

## 2014-12-17 ENCOUNTER — Encounter: Payer: Self-pay | Admitting: Internal Medicine

## 2015-01-14 ENCOUNTER — Telehealth: Payer: Self-pay | Admitting: Internal Medicine

## 2015-01-14 NOTE — Telephone Encounter (Signed)
Spoke with patient and offered to move up OV. He will call back to do this after he talks with his wife.

## 2015-01-14 NOTE — Telephone Encounter (Signed)
Moved OV to 01/17/15 at 1:45 PM.

## 2015-01-14 NOTE — Telephone Encounter (Signed)
Pt called and has another appt on 01/17/15 and would like appt moved to 01/24/15. Pts appt moved to 01/24/15@1 :15pm. Pt aware of appt.

## 2015-01-17 ENCOUNTER — Ambulatory Visit: Payer: Medicare Other | Admitting: Physician Assistant

## 2015-01-21 ENCOUNTER — Encounter: Payer: Self-pay | Admitting: *Deleted

## 2015-01-24 ENCOUNTER — Encounter: Payer: Self-pay | Admitting: Physician Assistant

## 2015-01-24 ENCOUNTER — Ambulatory Visit: Payer: Medicare Other | Admitting: Physician Assistant

## 2015-01-24 ENCOUNTER — Ambulatory Visit (INDEPENDENT_AMBULATORY_CARE_PROVIDER_SITE_OTHER): Payer: Medicare Other | Admitting: Physician Assistant

## 2015-01-24 VITALS — BP 132/82 | HR 68 | Ht 68.5 in | Wt 224.0 lb

## 2015-01-24 DIAGNOSIS — K219 Gastro-esophageal reflux disease without esophagitis: Secondary | ICD-10-CM

## 2015-01-24 DIAGNOSIS — K5900 Constipation, unspecified: Secondary | ICD-10-CM

## 2015-01-24 DIAGNOSIS — R1310 Dysphagia, unspecified: Secondary | ICD-10-CM | POA: Diagnosis not present

## 2015-01-24 MED ORDER — OMEPRAZOLE 40 MG PO CPDR: 40.0000 mg | DELAYED_RELEASE_CAPSULE | Freq: Two times a day (BID) | ORAL | Status: DC

## 2015-01-24 MED ORDER — POLYETHYLENE GLYCOL 3350 17 GM/SCOOP PO POWD: ORAL | Status: DC

## 2015-01-24 NOTE — Patient Instructions (Addendum)
You have been scheduled for an endoscopy. Please follow written instructions given to you at your visit today. If you use inhalers (even only as needed), please bring them with you on the day of your procedure. Your physician has requested that you go to www.startemmi.com and enter the access code given to you at your visit today. This web site gives a general overview about your procedure. However, you should still follow specific instructions given to you by our office regarding your preparation for the procedure.  We have sent the following medications to your pharmacy for you to pick up at your convenience: Omeprazole 40 mg twice daily Miralax- 2 capfuls daily  Please make certain to eat plenty of "P" fruits: prunes, peaches, pears, pineapple, plums  You have been scheduled for a Barium Esophogram at [facility] (1st floor of the hospital) on 01/27/15 at 10:00 am. Please arrive 30 minutes prior to your appointment for registration. Make certain not to have anything to eat or drink 6 hours prior to your test. If you need to reschedule for any reason, please contact radiology at [phone] to do so. __________________________________________________________________ A barium swallow is an examination that concentrates on views of the esophagus. This tends to be a double contrast exam (barium and two liquids which, when combined, create a gas to distend the wall of the oesophagus) or single contrast (non-ionic iodine based). The study is usually tailored to your symptoms so a good history is essential. Attention is paid during the study to the form, structure and configuration of the esophagus, looking for functional disorders (such as aspiration, dysphagia, achalasia, motility and reflux) EXAMINATION You may be asked to change into a gown, depending on the type of swallow being performed. A radiologist and radiographer will perform the procedure. The radiologist will advise you of the type  of contrast selected for your procedure and direct you during the exam. You will be asked to stand, sit or lie in several different positions and to hold a small amount of fluid in your mouth before being asked to swallow while the imaging is performed .In some instances you may be asked to swallow barium coated marshmallows to assess the motility of a solid food bolus. The exam can be recorded as a digital or video fluoroscopy procedure. POST PROCEDURE It will take 1-2 days for the barium to pass through your system. To facilitate this, it is important, unless otherwise directed, to increase your fluids for the next 24-48hrs and to resume your normal diet.  This test typically takes about 30 minutes to perform. __________________________________________________________________________________

## 2015-01-24 NOTE — Progress Notes (Signed)
Reviewed and agree.

## 2015-01-24 NOTE — Progress Notes (Signed)
Patient ID: Don Garcia, male   DOB: 1945-12-25, 69 y.o.   MRN: [id number]    HPI:  Don Garcia is a 69 y.o.   male referred by Hamrick, Durward Fortes, MD for evaluation of dysphagia. He is known to Dr. Juanda Chance from a colonoscopy in July 2013 at which time he was noted to have scattered diverticuli and was advised to have surveillance in 10 years. He has also had an EGD in March 2006 which was performed due to findings of a positive fecal occult blood test. He was noted to have some antral erythema with a negative CLOtest. He was treated with Nexium.  He presents today with a 6 month history of dysphagia to solids and liquids that has been becoming progressively more severe. He is unable to eat any solids without sipping water to push it down yet he then feels liquids get stuck and bulb blood. He does not feel as if liquids or pooling in his throat and he sputters, but says food builds up in his esophagus. He denies epigastric pain, nausea, or vomiting. His weight has been stable. He also reports that he has been fairly constipated and has been skipping 3 or 4 days between bowel movements. He has no bright red blood per rectum or melena. He admits that he does not like fruits or vegetables and does not drink a lot of fluids during the day. His past medical history is significant for GERD, Barrett's esophagus in the past. Anxiety, depression, hyperlipidemia, COPD, hypertension, and sleep apnea.    Past Medical History  Diagnosis Date  . Hypertension   . COPD (chronic obstructive pulmonary disease)   . Hyperlipidemia   . Depression   . Anxiety   . GERD (gastroesophageal reflux disease)   . Barrett esophagus   . Diverticulosis     Past Surgical History  Procedure Laterality Date  . Lumbar laminectomy  1988  . Back fusion  2003  . Knee arthroscopy  1998    bilateral  . Nasal sinus surgery  1985   Family History  Problem Relation Age of Onset  . Colon cancer Neg Hx   . Stomach cancer  Neg Hx   . Diabetes Paternal Uncle   . Diabetes Maternal Grandfather   . Renal cancer Paternal Uncle   . Lung disease Mother    Social History  Substance Use Topics  . Smoking status: Never Smoker   . Smokeless tobacco: Never Used  . Alcohol Use: No   Current Outpatient Prescriptions  Medication Sig Dispense Refill  . amLODipine (NORVASC) 5 MG tablet Take 5 mg by mouth daily.     Marland Kitchen aspirin 325 MG tablet Take 325 mg by mouth daily.    Marland Kitchen buPROPion (WELLBUTRIN XL) 300 MG 24 hr tablet Take 300 mg by mouth daily.    Marland Kitchen desmopressin (DDAVP) 0.2 MG tablet Take 0.2 mg by mouth 2 (two) times daily.    Marland Kitchen donepezil (ARICEPT) 10 MG tablet Take 10 mg by mouth at bedtime.    . hydrOXYzine (ATARAX/VISTARIL) 25 MG tablet Take 25 mg by mouth 3 (three) times daily as needed.    . lovastatin (MEVACOR) 40 MG tablet Take 20 mg by mouth at bedtime. Takes 2 tablets at bedtime    . meloxicam (MOBIC) 15 MG tablet Take 15 mg by mouth daily.    . metoprolol succinate (TOPROL-XL) 50 MG 24 hr tablet Take 50 mg by mouth daily. Take with or immediately following a meal.    .  montelukast (SINGULAIR) 10 MG tablet Take 10 mg by mouth at bedtime.    . Multiple Vitamin (MULTIVITAMIN) capsule Take 1 capsule by mouth daily.    Marland Kitchen OLANZapine (ZYPREXA) 2.5 MG tablet Take 2.5 mg by mouth at bedtime.    . ranitidine (ZANTAC) 300 MG tablet Take 300 mg by mouth at bedtime.    Marland Kitchen rOPINIRole (REQUIP) 1 MG tablet Take 1 mg by mouth 3 (three) times daily.    . sertraline (ZOLOFT) 100 MG tablet Take 100 mg by mouth 2 (two) times daily.     Marland Kitchen omeprazole (PRILOSEC) 40 MG capsule Take 1 capsule (40 mg total) by mouth 2 (two) times daily. 60 capsule 2  . polyethylene glycol powder (GLYCOLAX/MIRALAX) powder Dissolve 2 capfuls in at least 16 ounces water/juice and drink daily 1000 g 2   No current facility-administered medications for this visit.   Allergies  Allergen Reactions  . Celebrex [Celecoxib]     lethargy  . Vioxx [Rofecoxib]      lethargy     Review of Systems: Gen: Denies any fever, chills, sweats, anorexia, fatigue, weakness, malaise, weight loss, and sleep disorder CV: Denies chest pain, angina, palpitations, syncope, orthopnea, PND, peripheral edema, and claudication. Resp: Denies dyspnea at rest, dyspnea with exercise, cough, sputum, wheezing, coughing up blood, and pleurisy. GI: Denies vomiting blood, jaundice, and fecal incontinence.   Admits to dysphagia to solids and liquids GU : Denies urinary burning, blood in urine, urinary frequency, urinary hesitancy, nocturnal urination, and urinary incontinence. MS: Denies joint pain, limitation of movement, and swelling, stiffness, low back pain, extremity pain. Denies muscle weakness, cramps, atrophy.  Derm: Denies rash, itching, dry skin, hives, moles, warts, or unhealing ulcers.  Psych: Denies depression, anxiety, memory loss, suicidal ideation, hallucinations, paranoia, and confusion. Heme: Denies bruising, bleeding, and enlarged lymph nodes. Neuro:  Denies any headaches, dizziness, paresthesias. Endo:  Denies any problems with DM, thyroid, adrenal function    Prior Endoscopies:   See history of present illness  Physical Exam: BP 132/82 mmHg  Pulse 68  Ht 5' 8.5" (1.74 m)  Wt 224 lb (101.606 kg)  BMI 33.56 kg/m2 Constitutional: Pleasant,well-developed, elderly Caucasian male in no acute distress. HEENT: Normocephalic and atraumatic. Conjunctivae are normal. No scleral icterus. Neck supple. No adenopathy Cardiovascular: Normal rate, regular rhythm.  Pulmonary/chest: Effort normal and breath sounds normal. No wheezing, rales or rhonchi. Abdominal: Soft, nondistended, nontender. Bowel sounds active throughout. There are no masses palpable. No hepatomegaly. Extremities: no edema Lymphadenopathy: No cervical adenopathy noted. Neurological: Alert and oriented to person place and time. Skin: Skin is warm and dry. No rashes noted. Psychiatric: Normal mood  and affect. Behavior is normal.  ASSESSMENT AND PLAN: 69 year old male with a history of GERD presenting with 6 months of progressive dysphagia to solids and liquids. Patient will be scheduled for barium swallow with tablet to evaluate for possible stricture, esophageal wall thickening, etc. He will then be scheduled for an EGD with possible dilation.The risks, benefits, and alternatives to endoscopy with possible biopsy and possible dilation were discussed with the patient and they consent to proceed. In the meantime, he will increase his omeprazole 40 mg twice a day. The procedure will be scheduled with Dr. Adela Lank.  With regards to his constipation. He has been instructed to try to add "P fruits" to his diet and to increase water intake. He will also try Mira lax 2 capfuls daily.  Further recommendations will be made pending the findings of the above.  Trini Soldo, Tollie Pizza PA-C 01/24/2015, 2:50 PM  CC: Hamrick, Durward Fortes, MD

## 2015-01-27 ENCOUNTER — Ambulatory Visit (HOSPITAL_COMMUNITY)
Admission: RE | Admit: 2015-01-27 | Discharge: 2015-01-27 | Disposition: A | Discharge status: Home / Self Care | Payer: Medicare Other | Source: Ambulatory Visit | Attending: Physician Assistant | Admitting: Physician Assistant

## 2015-01-27 ENCOUNTER — Other Ambulatory Visit (HOSPITAL_COMMUNITY): Payer: Self-pay | Admitting: Internal Medicine

## 2015-01-27 ENCOUNTER — Other Ambulatory Visit: Payer: Self-pay | Admitting: *Deleted

## 2015-01-27 DIAGNOSIS — K219 Gastro-esophageal reflux disease without esophagitis: Secondary | ICD-10-CM | POA: Diagnosis not present

## 2015-01-27 DIAGNOSIS — R1310 Dysphagia, unspecified: Secondary | ICD-10-CM | POA: Diagnosis present

## 2015-01-27 DIAGNOSIS — K5900 Constipation, unspecified: Secondary | ICD-10-CM | POA: Insufficient documentation

## 2015-01-28 ENCOUNTER — Other Ambulatory Visit (HOSPITAL_COMMUNITY): Payer: Self-pay | Admitting: Physician Assistant

## 2015-01-28 DIAGNOSIS — R1314 Dysphagia, pharyngoesophageal phase: Secondary | ICD-10-CM

## 2015-01-29 ENCOUNTER — Ambulatory Visit (HOSPITAL_COMMUNITY): Payer: Medicare Other

## 2015-01-29 ENCOUNTER — Telehealth: Payer: Self-pay | Admitting: Internal Medicine

## 2015-01-29 NOTE — Telephone Encounter (Signed)
Spoke with patient and gave him the report from esophagram again.

## 2015-02-07 ENCOUNTER — Ambulatory Visit (HOSPITAL_COMMUNITY)
Admission: RE | Admit: 2015-02-07 | Discharge: 2015-02-07 | Disposition: A | Discharge status: Home / Self Care | Payer: Medicare Other | Source: Ambulatory Visit | Attending: Physician Assistant | Admitting: Physician Assistant

## 2015-02-07 ENCOUNTER — Inpatient Hospital Stay (HOSPITAL_COMMUNITY): Admission: RE | Admit: 2015-02-07 | Payer: Medicare Other | Source: Ambulatory Visit

## 2015-02-07 DIAGNOSIS — R1314 Dysphagia, pharyngoesophageal phase: Secondary | ICD-10-CM | POA: Insufficient documentation

## 2015-02-07 DIAGNOSIS — R1310 Dysphagia, unspecified: Secondary | ICD-10-CM

## 2015-02-10 ENCOUNTER — Ambulatory Visit (HOSPITAL_COMMUNITY): Payer: Medicare Other

## 2015-02-25 ENCOUNTER — Ambulatory Visit: Payer: Medicare Other | Admitting: Internal Medicine

## 2015-03-05 DIAGNOSIS — K219 Gastro-esophageal reflux disease without esophagitis: Secondary | ICD-10-CM | POA: Insufficient documentation

## 2015-03-05 DIAGNOSIS — J310 Chronic rhinitis: Secondary | ICD-10-CM | POA: Insufficient documentation

## 2015-03-05 DIAGNOSIS — J309 Allergic rhinitis, unspecified: Secondary | ICD-10-CM | POA: Insufficient documentation

## 2015-03-05 DIAGNOSIS — J45909 Unspecified asthma, uncomplicated: Secondary | ICD-10-CM | POA: Insufficient documentation

## 2015-03-21 ENCOUNTER — Encounter: Payer: Medicare Other | Admitting: Gastroenterology

## 2015-06-11 ENCOUNTER — Emergency Department (HOSPITAL_COMMUNITY): Payer: Medicare Other

## 2015-06-11 ENCOUNTER — Encounter (HOSPITAL_COMMUNITY): Payer: Self-pay | Admitting: *Deleted

## 2015-06-11 ENCOUNTER — Inpatient Hospital Stay (HOSPITAL_COMMUNITY)
Admission: EM | Admit: 2015-06-11 | Discharge: 2015-06-13 | DRG: 056 | Disposition: A | Discharge status: Hospice | Payer: Medicare Other | Attending: Internal Medicine | Admitting: Internal Medicine

## 2015-06-11 DIAGNOSIS — Z981 Arthrodesis status: Secondary | ICD-10-CM

## 2015-06-11 DIAGNOSIS — Z66 Do not resuscitate: Secondary | ICD-10-CM | POA: Diagnosis not present

## 2015-06-11 DIAGNOSIS — I10 Essential (primary) hypertension: Secondary | ICD-10-CM | POA: Diagnosis present

## 2015-06-11 DIAGNOSIS — G1222 Progressive bulbar palsy: Principal | ICD-10-CM | POA: Diagnosis present

## 2015-06-11 DIAGNOSIS — Z7189 Other specified counseling: Secondary | ICD-10-CM | POA: Insufficient documentation

## 2015-06-11 DIAGNOSIS — G9340 Encephalopathy, unspecified: Secondary | ICD-10-CM | POA: Diagnosis present

## 2015-06-11 DIAGNOSIS — G309 Alzheimer's disease, unspecified: Secondary | ICD-10-CM | POA: Diagnosis present

## 2015-06-11 DIAGNOSIS — R1310 Dysphagia, unspecified: Secondary | ICD-10-CM | POA: Diagnosis not present

## 2015-06-11 DIAGNOSIS — F329 Major depressive disorder, single episode, unspecified: Secondary | ICD-10-CM | POA: Diagnosis present

## 2015-06-11 DIAGNOSIS — F05 Delirium due to known physiological condition: Secondary | ICD-10-CM | POA: Insufficient documentation

## 2015-06-11 DIAGNOSIS — G1220 Motor neuron disease, unspecified: Secondary | ICD-10-CM

## 2015-06-11 DIAGNOSIS — R633 Feeding difficulties: Secondary | ICD-10-CM | POA: Diagnosis present

## 2015-06-11 DIAGNOSIS — J449 Chronic obstructive pulmonary disease, unspecified: Secondary | ICD-10-CM | POA: Diagnosis present

## 2015-06-11 DIAGNOSIS — R410 Disorientation, unspecified: Secondary | ICD-10-CM | POA: Diagnosis not present

## 2015-06-11 DIAGNOSIS — Z515 Encounter for palliative care: Secondary | ICD-10-CM | POA: Diagnosis present

## 2015-06-11 DIAGNOSIS — F0280 Dementia in other diseases classified elsewhere without behavioral disturbance: Secondary | ICD-10-CM | POA: Diagnosis present

## 2015-06-11 DIAGNOSIS — Z79899 Other long term (current) drug therapy: Secondary | ICD-10-CM

## 2015-06-11 DIAGNOSIS — F419 Anxiety disorder, unspecified: Secondary | ICD-10-CM | POA: Diagnosis present

## 2015-06-11 DIAGNOSIS — K219 Gastro-esophageal reflux disease without esophagitis: Secondary | ICD-10-CM | POA: Diagnosis present

## 2015-06-11 DIAGNOSIS — R061 Stridor: Secondary | ICD-10-CM | POA: Diagnosis present

## 2015-06-11 DIAGNOSIS — E785 Hyperlipidemia, unspecified: Secondary | ICD-10-CM | POA: Diagnosis present

## 2015-06-11 DIAGNOSIS — R471 Dysarthria and anarthria: Secondary | ICD-10-CM | POA: Diagnosis present

## 2015-06-11 DIAGNOSIS — J45909 Unspecified asthma, uncomplicated: Secondary | ICD-10-CM | POA: Diagnosis present

## 2015-06-11 DIAGNOSIS — Z7982 Long term (current) use of aspirin: Secondary | ICD-10-CM

## 2015-06-11 DIAGNOSIS — K2270 Barrett's esophagus without dysplasia: Secondary | ICD-10-CM | POA: Diagnosis present

## 2015-06-11 LAB — URINALYSIS, ROUTINE W REFLEX MICROSCOPIC
BILIRUBIN URINE: NEGATIVE
Glucose, UA: NEGATIVE mg/dL
Ketones, ur: NEGATIVE mg/dL
NITRITE: NEGATIVE
PH: 7 (ref 5.0–8.0)
Protein, ur: NEGATIVE mg/dL
SPECIFIC GRAVITY, URINE: 1.026 (ref 1.005–1.030)

## 2015-06-11 LAB — CBC
HEMATOCRIT: 41.4 % (ref 39.0–52.0)
HEMOGLOBIN: 14.1 g/dL (ref 13.0–17.0)
MCH: 30.2 pg (ref 26.0–34.0)
MCHC: 34.1 g/dL (ref 30.0–36.0)
MCV: 88.7 fL (ref 78.0–100.0)
Platelets: 163 10*3/uL (ref 150–400)
RBC: 4.67 MIL/uL (ref 4.22–5.81)
RDW: 13.8 % (ref 11.5–15.5)
WBC: 6.8 10*3/uL (ref 4.0–10.5)

## 2015-06-11 LAB — DIFFERENTIAL
Basophils Absolute: 0 10*3/uL (ref 0.0–0.1)
Basophils Relative: 0 %
EOS ABS: 0.2 10*3/uL (ref 0.0–0.7)
EOS PCT: 3 %
LYMPHS ABS: 1.6 10*3/uL (ref 0.7–4.0)
LYMPHS PCT: 23 %
MONOS PCT: 9 %
Monocytes Absolute: 0.6 10*3/uL (ref 0.1–1.0)
Neutro Abs: 4.4 10*3/uL (ref 1.7–7.7)
Neutrophils Relative %: 65 %

## 2015-06-11 LAB — URINE MICROSCOPIC-ADD ON

## 2015-06-11 LAB — COMPREHENSIVE METABOLIC PANEL
ALK PHOS: 100 U/L (ref 38–126)
ALT: 43 U/L (ref 17–63)
ANION GAP: 7 (ref 5–15)
AST: 25 U/L (ref 15–41)
Albumin: 3.4 g/dL — ABNORMAL LOW (ref 3.5–5.0)
BILIRUBIN TOTAL: 0.4 mg/dL (ref 0.3–1.2)
BUN: 20 mg/dL (ref 6–20)
CALCIUM: 9 mg/dL (ref 8.9–10.3)
CO2: 27 mmol/L (ref 22–32)
Chloride: 107 mmol/L (ref 101–111)
Creatinine, Ser: 1.04 mg/dL (ref 0.61–1.24)
GFR calc Af Amer: >60 mL/min (ref >60)
Glucose, Bld: 97 mg/dL (ref 65–99)
POTASSIUM: 4.1 mmol/L (ref 3.5–5.1)
Sodium: 141 mmol/L (ref 135–145)
TOTAL PROTEIN: 5.9 g/dL — AB (ref 6.5–8.1)

## 2015-06-11 LAB — CK: CK TOTAL: 95 U/L (ref 49–397)

## 2015-06-11 LAB — RAPID URINE DRUG SCREEN, HOSP PERFORMED
AMPHETAMINES: NOT DETECTED
Barbiturates: NOT DETECTED
Benzodiazepines: POSITIVE — AB
Cocaine: NOT DETECTED
OPIATES: NOT DETECTED
Tetrahydrocannabinol: NOT DETECTED

## 2015-06-11 LAB — CBG MONITORING, ED: GLUCOSE-CAPILLARY: 88 mg/dL (ref 65–99)

## 2015-06-11 LAB — VALPROIC ACID LEVEL: VALPROIC ACID LVL: 32 ug/mL — AB (ref 50.0–100.0)

## 2015-06-11 LAB — AMMONIA: AMMONIA: 38 umol/L — AB (ref 9–35)

## 2015-06-11 NOTE — ED Notes (Signed)
Pt to CT

## 2015-06-11 NOTE — ED Provider Notes (Signed)
ENT Dr Jearld FentonByers at bedside for exam   Don Rhineonald Debraann Livingstone, MD 06/11/15 [id number]

## 2015-06-11 NOTE — ED Notes (Signed)
Family at bedside. 

## 2015-06-11 NOTE — ED Notes (Signed)
[facility]. Pt Parkinson's medications have recently been drastically decreased rt family concern of lethargy. Pt is now tremulous and pt family was concerned the pt was having seizure like activity. Pt has continuous involuntary jerking motions most likely rt Parkinson's. Pt is disoriented x4 at baseline rt dementia.

## 2015-06-11 NOTE — ED Provider Notes (Signed)
CSN: [id number]     Arrival date & time 06/11/15  1809 History   First MD Initiated Contact with Patient 06/11/15 1810     Chief Complaint  Patient presents with  . Seizures   LEVEL 5 CAVEAT DUE TO ALTERED MENTAL STATUS  Patient is a 69 y.o. male presenting with altered mental status. The history is provided by a relative.  Altered Mental Status Presenting symptoms: confusion   Severity:  Moderate Most recent episode:  Today Timing:  Intermittent Progression:  Unchanged Chronicity:  New Associated symptoms: seizures   Patient presents for altered mental status and possible seizures He lives in a nursing facility He has h/o parkinson Today he has been having episodes of confusion with sonorous respirations and also intermittent jerkin of his body The physician at facility felt he could be having "mini seizures"    Past Medical History  Diagnosis Date  . Hypertension   . COPD (chronic obstructive pulmonary disease) (HCC)   . Hyperlipidemia   . Depression   . Anxiety   . GERD (gastroesophageal reflux disease)   . Barrett esophagus   . Diverticulosis    Past Surgical History  Procedure Laterality Date  . Lumbar laminectomy  1988  . Back fusion  2003  . Knee arthroscopy  1998    bilateral  . Nasal sinus surgery  1985   Family History  Problem Relation Age of Onset  . Colon cancer Neg Hx   . Stomach cancer Neg Hx   . Diabetes Paternal Uncle   . Diabetes Maternal Grandfather   . Renal cancer Paternal Uncle   . Lung disease Mother    Social History  Substance Use Topics  . Smoking status: Never Smoker   . Smokeless tobacco: Never Used  . Alcohol Use: No    Review of Systems  Unable to perform ROS: Mental status change  Neurological: Positive for seizures.  Psychiatric/Behavioral: Positive for confusion.      Allergies  Celebrex and Vioxx  Home Medications   Prior to Admission medications   Medication Sig Start Date End Date Taking? Authorizing  Provider  albuterol (PROVENTIL HFA;VENTOLIN HFA) 108 (90 BASE) MCG/ACT inhaler Inhale 2 puffs into the lungs every 6 (six) hours as needed for wheezing or shortness of breath.    Historical Provider, MD  amLODipine (NORVASC) 5 MG tablet Take 5 mg by mouth daily.  12/17/11   Historical Provider, MD  aspirin 325 MG tablet Take 325 mg by mouth daily.    Historical Provider, MD  buPROPion (WELLBUTRIN XL) 300 MG 24 hr tablet Take 300 mg by mouth daily.    Historical Provider, MD  desmopressin (DDAVP) 0.2 MG tablet Take 0.2 mg by mouth 2 (two) times daily.    Historical Provider, MD  donepezil (ARICEPT) 10 MG tablet Take 10 mg by mouth at bedtime.    Historical Provider, MD  fluticasone (FLONASE) 50 MCG/ACT nasal spray Place 1 spray into both nostrils daily.    Historical Provider, MD  fluticasone (FLOVENT HFA) 220 MCG/ACT inhaler Inhale 2 puffs into the lungs daily.    Historical Provider, MD  hydrOXYzine (ATARAX/VISTARIL) 25 MG tablet Take 25 mg by mouth 3 (three) times daily as needed.    Historical Provider, MD  lovastatin (MEVACOR) 40 MG tablet Take 20 mg by mouth at bedtime. Takes 2 tablets at bedtime 11/02/11   Historical Provider, MD  meloxicam (MOBIC) 15 MG tablet Take 15 mg by mouth daily.    Historical Provider, MD  metoprolol succinate (TOPROL-XL) 50 MG 24 hr tablet Take 50 mg by mouth daily. Take with or immediately following a meal.    Historical Provider, MD  montelukast (SINGULAIR) 10 MG tablet Take 10 mg by mouth at bedtime.    Historical Provider, MD  Multiple Vitamin (MULTIVITAMIN) capsule Take 1 capsule by mouth daily.    Historical Provider, MD  OLANZapine (ZYPREXA) 2.5 MG tablet Take 2.5 mg by mouth at bedtime.    Historical Provider, MD  omeprazole (PRILOSEC) 40 MG capsule Take 1 capsule (40 mg total) by mouth 2 (two) times daily. 01/24/15   Lori P Hvozdovic, PA-C  polyethylene glycol powder (GLYCOLAX/MIRALAX) powder Dissolve 2 capfuls in at least 16 ounces water/juice and drink daily  01/24/15   Lori P Hvozdovic, PA-C  ranitidine (ZANTAC) 300 MG tablet Take 300 mg by mouth at bedtime.    Historical Provider, MD  rOPINIRole (REQUIP) 1 MG tablet Take 1 mg by mouth 3 (three) times daily.    Historical Provider, MD  sertraline (ZOLOFT) 100 MG tablet Take 100 mg by mouth 2 (two) times daily.  10/13/11   Historical Provider, MD   BP 135/82 mmHg  Pulse 64  Temp(Src) 97.7 F (36.5 C) (Rectal)  Resp 16  SpO2 97% Physical Exam CONSTITUTIONAL: elderly, ill appearing HEAD: Normocephalic/atraumatic EYES: PERRL ENMT: Mucous membranes dry, no tongue/lip swelling, uvula midline without edema.  Sonorous respirations noted at times No drooling noted NECK: supple no meningeal signs, no soft tissue masses noted SPINE/BACK:entire spine nontender CV: S1/S2 noted, no murmurs/rubs/gallops noted LUNGS: Lungs are clear to auscultation bilaterally, no apparent distress ABDOMEN: soft, nontender NEURO: Pt is somnolent but will intermittently wake up and follow commands.  He has brief intermittent jerking of his body.  He moves all extremitiesx4  EXTREMITIES: pulses normal/equal, full ROM SKIN: warm, color normal PSYCH: unable to assess  ED Course  Procedures   10:05 PM Pt with h/o parkinson disease He is having increasing altered mental status and also intermittent jerking of his body though I don't feel this represents seizures He will wake up at times and interact Unclear etiology, though could be related to his change in his medications His initial labs/imaging were negative I spoke to dr Jilda Panda gardner Will admit to hospital He will consult neurology  Labs Review Labs Reviewed  COMPREHENSIVE METABOLIC PANEL - Abnormal; Notable for the following:    Total Protein 5.9 (*)    Albumin 3.4 (*)    All other components within normal limits  URINE RAPID DRUG SCREEN, HOSP PERFORMED - Abnormal; Notable for the following:    Benzodiazepines POSITIVE (*)    All other components within  normal limits  URINALYSIS, ROUTINE W REFLEX MICROSCOPIC (NOT AT [facility]) - Abnormal; Notable for the following:    APPearance HAZY (*)    Hgb urine dipstick LARGE (*)    Leukocytes, UA TRACE (*)    All other components within normal limits  AMMONIA - Abnormal; Notable for the following:    Ammonia 38 (*)    All other components within normal limits  VALPROIC ACID LEVEL - Abnormal; Notable for the following:    Valproic Acid Lvl 32 (*)    All other components within normal limits  URINE MICROSCOPIC-ADD ON - Abnormal; Notable for the following:    Squamous Epithelial / LPF 0-5 (*)    Bacteria, UA RARE (*)    All other components within normal limits  CBC  DIFFERENTIAL  CK  CBG MONITORING, ED  Imaging Review Ct Head Wo Contrast  06/11/2015  CLINICAL DATA:  Patient with confusion. History of Parkinson's. Seizure like activity. EXAM: CT HEAD WITHOUT CONTRAST TECHNIQUE: Contiguous axial images were obtained from the base of the skull through the vertex without intravenous contrast. COMPARISON:  Brain CT 02/17/2015 FINDINGS: Lateral ventricles are prominent, stable from prior. No evidence for acute cortically based infarct, intracranial hemorrhage, mass lesion mass-effect. Orbits are unremarkable. Soft tissues are unremarkable. Mucosal thickening within the ethmoid air cells. Air-fluid level within the sphenoid sinus. Mastoid air cells are unremarkable. IMPRESSION: No acute intracranial process. Stable prominence of lateral ventricles bilaterally. Findings suggestive of paranasal sinusitis. Electronically Signed   By: Annia Beltrew  Davis M.D.   On: 06/11/2015 21:16   Dg Chest Portable 1 View  06/11/2015  CLINICAL DATA:  Confusion EXAM: PORTABLE CHEST 1 VIEW COMPARISON:  02/20/2015 chest radiograph. FINDINGS: Slightly low lung volumes. Stable cardiomediastinal silhouette with top-normal heart size. No pneumothorax. No pleural effusion. Vascular crowding without overt pulmonary edema. No focal lung  consolidation. IMPRESSION: Low lung volumes with no active cardiopulmonary disease. Electronically Signed   By: Delbert PhenixJason A Poff M.D.   On: 06/11/2015 20:26   I have personally reviewed and evaluated these  lab results as part of my medical decision-making.   EKG Interpretation   Date/Time:  Wednesday June 11 2015 19:36:30 EST Ventricular Rate:  64 PR Interval:  190 QRS Duration: 91 QT Interval:  430 QTC Calculation: 444 R Axis:   -44 Text Interpretation:  Sinus rhythm Left anterior fascicular block No  significant change since last tracing Confirmed by Bebe ShaggyWICKLINE  MD, Dorinda HillNALD  [phone]([id number]) on 06/11/2015 7:48:53 PM      MDM   Final diagnoses:  Encephalopathy acute    Nursing notes including past medical history and social history reviewed and considered in documentation Labs/vital reviewed myself and considered during evaluation     Zadie Rhineonald Makaylin Carlo, MD 06/11/15 2207

## 2015-06-11 NOTE — ED Provider Notes (Signed)
D/w dr gardner and dr Amada Jupiterkirkpatrick They have seen patient They feel he may have aspirated as this occurred soon after eating He has some coarse upper airway sounds I consulted ENT Dr Jearld FentonByers and we discussed case He requests pulmonology evaluation  Zadie Rhineonald Darcee Dekker, MD 06/11/15 2258

## 2015-06-11 NOTE — ED Notes (Signed)
CBG 88 

## 2015-06-11 NOTE — Consult Note (Signed)
Neurology Consultation Reason for Consult: Confusion Referring Physician: Bebe ShaggyWickline, D  CC: Confusion  History is obtained from: Family  HPI: Don BarrierMichael L Garcia is a 69 y.o. male with a history of Alzheimer's type dementia(Diagnosed clinically by Dr. Adella [place]Applegate in [place]) Who presents with abnormal movements and increased confusion since earlier this evening. He was in [facility] nursing home then  was seen to have apparent difficulty breathing following a meal with subsequent question of seizure activity. This was described family as "mini seizure." It is not clear to me if he ever completely lost consciousness or had true tonic-clonic activity. What he is having  is intermittent quality purposeful movements of all 4 strategies with preserved consciousness. He appears in mild distress and is mildly agitated.    He has had progressive difficulty swallowing over the past 6 months, and progressively had worsening difficulty with speech with no real ability to form speech output since September. Of note, on his barium swallow in August he had both silent and sensed aspiration during that study. It was felt to be predominantly sensory.  He was evaluated by gastroenterology He was evaluated by speech therapy and started on thickened liquids   Of note, the patient appeared the same way following aspiration event in September. It was this event that led to him being placed in a nursing home.  He is unable to feed himself at baseline.  ROS: A 14 point ROS was performed and is negative except as noted in the HPI.   Past Medical History  Diagnosis Date  . Hypertension   . COPD (chronic obstructive pulmonary disease) (HCC)   . Hyperlipidemia   . Depression   . Anxiety   . GERD (gastroesophageal reflux disease)   . Barrett esophagus   . Diverticulosis      Family History  Problem Relation Age of Onset  . Colon cancer Neg Hx   . Stomach cancer Neg Hx   . Diabetes Paternal Uncle   . Diabetes  Maternal Grandfather   . Renal cancer Paternal Uncle   . Lung disease Mother      Social History:  reports that he has never smoked. He has never used smokeless tobacco. He reports that he does not drink alcohol or use illicit drugs.   Exam: Current vital signs: BP 139/98 mmHg  Pulse 61  Temp(Src) 97.7 F (36.5 C) (Rectal)  Resp 24  SpO2 95% Vital signs in last 24 hours: Temp:  [97.7 F (36.5 C)-97.9 F (36.6 C)] 97.7 F (36.5 C) (12/28 1932) Pulse Rate:  [60-76] 61 (12/28 2300) Resp:  [16-27] 24 (12/28 2130) BP: (120-158)/(79-98) 139/98 mmHg (12/28 2300) SpO2:  [90 %-100 %] 95 % (12/28 2300)  Physical Exam  Constitutional: Appears well-developed and well-nourished. He appears mildly agitated.  Psych: does not speak Eyes: No scleral injection HENT: no clear oral obstruction Head: Normocephalic.  Cardiovascular: Normal rate and regular rhythm.  Respiratory: Lots of upper airway/? Soft palate sounds audible at bedside. GI: Soft.  No distension. There is no tenderness.  Skin: WDI  Neuro: Mental Status: Patient is awake,  he makes some unintelligible sounds, but no speech. He does follow commands. Intermittently, he appears to be staring into space. He is readily engageable and will follow commands.  Cranial Nerves: II:  blinks to threat bilaterally Pupils are equal, round, and reactive to light.   III,IV, VI: EOMI without ptosis or diploplia.  V:  responds to facial stimulation bilaterally VII: Face appears symmetric.  VIII: hearing is  intact to voice X: Uvula elevates symmetrically XI: Shoulder shrug is symmetric. XII: tongue is  very posterior in the mouth, but he is able to protrude it. There is a prominent furrough but no fasciculations  Motor: Tone is normal. Bulk is normal. 5/5 strength was present in all four extremities,  with the exception of the left ankle where he does not dorsiflex (old deficit)   he has quasi-purposeful movements in all 4 extremities.   Sensory: he does respond to stimulation in all 4 extremities  Deep Tendon Reflexes: 2+ and symmetric in the biceps and 1+ in the patellae.  Plantars: Toes are downgoing bilaterally.  Cerebellar:  he does not perform  I have reviewed labs in epic and the results pertinent to this consultation are:  CMP-unremarkable   I have reviewed the images obtained: CT head-atrophy   Impression: 69 year old male with a diagnosis of mild Alzheimer's dementia who has been having progressive dysphagia, dysarthria progressing to anarthria for the past 6-12 months.  The degree of his bulbar dysfunction(Based predominantly on history ) is quite striking and does raise the question of progressive bulbar palsy (e.g. motor neuron disease). Other causes such as myasthenia are rarely so confined to the bulbar area. He doe snot have any definite findings of more generalized disease, however, to support this. .   It is not clear to me that he ever had a seizure tonight, but especially with his diagnosis of Alzheimer's aspiration event could cause increasing confusion.   Recommendations: 1) MRI brain to exclude stroke or other structural cause 2) EEG 3) airway management per ENT/CCM 4) Will need outpatient EMG   Ritta Slot, MD [facility] [phone]  If 7pm- 7am, please page neurology on call as listed in AMION.

## 2015-06-12 ENCOUNTER — Inpatient Hospital Stay (HOSPITAL_COMMUNITY): Payer: Medicare Other

## 2015-06-12 ENCOUNTER — Encounter (HOSPITAL_COMMUNITY): Payer: Self-pay | Admitting: *Deleted

## 2015-06-12 DIAGNOSIS — K2270 Barrett's esophagus without dysplasia: Secondary | ICD-10-CM | POA: Diagnosis present

## 2015-06-12 DIAGNOSIS — Z515 Encounter for palliative care: Secondary | ICD-10-CM

## 2015-06-12 DIAGNOSIS — G1220 Motor neuron disease, unspecified: Secondary | ICD-10-CM | POA: Diagnosis not present

## 2015-06-12 DIAGNOSIS — Z66 Do not resuscitate: Secondary | ICD-10-CM | POA: Diagnosis not present

## 2015-06-12 DIAGNOSIS — F0280 Dementia in other diseases classified elsewhere without behavioral disturbance: Secondary | ICD-10-CM | POA: Diagnosis not present

## 2015-06-12 DIAGNOSIS — K219 Gastro-esophageal reflux disease without esophagitis: Secondary | ICD-10-CM | POA: Diagnosis present

## 2015-06-12 DIAGNOSIS — R410 Disorientation, unspecified: Secondary | ICD-10-CM | POA: Diagnosis present

## 2015-06-12 DIAGNOSIS — G9340 Encephalopathy, unspecified: Secondary | ICD-10-CM | POA: Insufficient documentation

## 2015-06-12 DIAGNOSIS — F05 Delirium due to known physiological condition: Secondary | ICD-10-CM

## 2015-06-12 DIAGNOSIS — G309 Alzheimer's disease, unspecified: Secondary | ICD-10-CM | POA: Diagnosis not present

## 2015-06-12 DIAGNOSIS — G1222 Progressive bulbar palsy: Secondary | ICD-10-CM | POA: Diagnosis present

## 2015-06-12 DIAGNOSIS — Z7982 Long term (current) use of aspirin: Secondary | ICD-10-CM | POA: Diagnosis not present

## 2015-06-12 DIAGNOSIS — Z79899 Other long term (current) drug therapy: Secondary | ICD-10-CM | POA: Diagnosis not present

## 2015-06-12 DIAGNOSIS — J449 Chronic obstructive pulmonary disease, unspecified: Secondary | ICD-10-CM | POA: Diagnosis present

## 2015-06-12 DIAGNOSIS — Z981 Arthrodesis status: Secondary | ICD-10-CM | POA: Diagnosis not present

## 2015-06-12 DIAGNOSIS — F329 Major depressive disorder, single episode, unspecified: Secondary | ICD-10-CM | POA: Diagnosis present

## 2015-06-12 DIAGNOSIS — R633 Feeding difficulties: Secondary | ICD-10-CM | POA: Diagnosis present

## 2015-06-12 DIAGNOSIS — F419 Anxiety disorder, unspecified: Secondary | ICD-10-CM | POA: Diagnosis present

## 2015-06-12 DIAGNOSIS — R1310 Dysphagia, unspecified: Secondary | ICD-10-CM | POA: Diagnosis not present

## 2015-06-12 DIAGNOSIS — Z7189 Other specified counseling: Secondary | ICD-10-CM

## 2015-06-12 DIAGNOSIS — J45909 Unspecified asthma, uncomplicated: Secondary | ICD-10-CM | POA: Diagnosis present

## 2015-06-12 DIAGNOSIS — I10 Essential (primary) hypertension: Secondary | ICD-10-CM | POA: Diagnosis present

## 2015-06-12 DIAGNOSIS — R061 Stridor: Secondary | ICD-10-CM | POA: Diagnosis present

## 2015-06-12 DIAGNOSIS — E785 Hyperlipidemia, unspecified: Secondary | ICD-10-CM | POA: Diagnosis present

## 2015-06-12 DIAGNOSIS — R471 Dysarthria and anarthria: Secondary | ICD-10-CM | POA: Diagnosis present

## 2015-06-12 MED ORDER — ADULT MULTIVITAMIN W/MINERALS CH: 1.0000 | ORAL_TABLET | Freq: Every day | ORAL | Status: DC

## 2015-06-12 MED ORDER — FLUTICASONE PROPIONATE 50 MCG/ACT NA SUSP
1.0000 | Freq: Every day | NASAL | Status: DC
  Administered 2015-06-12: 1 via NASAL
  Filled 2015-06-12: qty 16

## 2015-06-12 MED ORDER — PRAVASTATIN SODIUM 20 MG PO TABS: 20.0000 mg | ORAL_TABLET | Freq: Every day | ORAL | Status: DC

## 2015-06-12 MED ORDER — SODIUM CHLORIDE 0.9 % IV SOLN
INTRAVENOUS | Status: DC
  Administered 2015-06-12 – 2015-06-13 (×3): via INTRAVENOUS

## 2015-06-12 MED ORDER — ROPINIROLE HCL 1 MG PO TABS: 1.0000 mg | ORAL_TABLET | Freq: Every day | ORAL | Status: DC

## 2015-06-12 MED ORDER — MELOXICAM 7.5 MG PO TABS: 15.0000 mg | ORAL_TABLET | Freq: Every day | ORAL | Status: DC

## 2015-06-12 MED ORDER — METOPROLOL TARTRATE 25 MG/10 ML ORAL SUSPENSION
25.0000 mg | Freq: Two times a day (BID) | ORAL | Status: DC
  Filled 2015-06-12 (×4): qty 10

## 2015-06-12 MED ORDER — ADULT MULTIVITAMIN LIQUID CH
5.0000 mL | Freq: Every day | ORAL | Status: DC
  Filled 2015-06-12: qty 5

## 2015-06-12 MED ORDER — METOPROLOL SUCCINATE ER 25 MG PO TB24: 50.0000 mg | ORAL_TABLET | Freq: Every day | ORAL | Status: DC

## 2015-06-12 MED ORDER — SERTRALINE HCL 100 MG PO TABS: 100.0000 mg | ORAL_TABLET | Freq: Two times a day (BID) | ORAL | Status: DC

## 2015-06-12 MED ORDER — BENZTROPINE MESYLATE 1 MG PO TABS: 1.0000 mg | ORAL_TABLET | Freq: Every day | ORAL | Status: DC

## 2015-06-12 MED ORDER — ALPRAZOLAM 0.5 MG PO TABS: 0.5000 mg | ORAL_TABLET | Freq: Every evening | ORAL | Status: DC | PRN

## 2015-06-12 MED ORDER — HEPARIN SODIUM (PORCINE) 5000 UNIT/ML IJ SOLN
5000.0000 [IU] | Freq: Three times a day (TID) | INTRAMUSCULAR | Status: DC
  Administered 2015-06-12 – 2015-06-13 (×4): 5000 [IU] via SUBCUTANEOUS
  Filled 2015-06-12 (×4): qty 1

## 2015-06-12 MED ORDER — PRAMIPEXOLE DIHYDROCHLORIDE 0.125 MG PO TABS: 0.5000 mg | ORAL_TABLET | Freq: Two times a day (BID) | ORAL | Status: DC

## 2015-06-12 MED ORDER — AMLODIPINE BESYLATE 5 MG PO TABS: 5.0000 mg | ORAL_TABLET | Freq: Every day | ORAL | Status: DC

## 2015-06-12 MED ORDER — DESMOPRESSIN ACETATE 0.2 MG PO TABS
0.2000 mg | ORAL_TABLET | Freq: Two times a day (BID) | ORAL | Status: DC
  Filled 2015-06-12 (×4): qty 1

## 2015-06-12 MED ORDER — HYDRALAZINE HCL 20 MG/ML IJ SOLN: 10.0000 mg | Freq: Four times a day (QID) | INTRAMUSCULAR | Status: DC | PRN

## 2015-06-12 MED ORDER — TEMAZEPAM 15 MG PO CAPS: 15.0000 mg | ORAL_CAPSULE | Freq: Every evening | ORAL | Status: DC | PRN

## 2015-06-12 MED ORDER — SERTRALINE HCL 50 MG PO TABS: 100.0000 mg | ORAL_TABLET | Freq: Two times a day (BID) | ORAL | Status: DC

## 2015-06-12 MED ORDER — ASPIRIN 81 MG PO CHEW: 81.0000 mg | CHEWABLE_TABLET | Freq: Every day | ORAL | Status: DC

## 2015-06-12 MED ORDER — RANITIDINE HCL 150 MG/10ML PO SYRP
300.0000 mg | ORAL_SOLUTION | Freq: Every day | ORAL | Status: DC
Start: 2015-06-12 — End: 2015-06-13
  Filled 2015-06-12 (×2): qty 20

## 2015-06-12 MED ORDER — OLANZAPINE 5 MG PO TABS: 2.5000 mg | ORAL_TABLET | Freq: Every day | ORAL | Status: DC

## 2015-06-12 MED ORDER — MULTIVITAMINS PO CAPS: 1.0000 | ORAL_CAPSULE | Freq: Every day | ORAL | Status: DC

## 2015-06-12 MED ORDER — BUPROPION HCL 100 MG PO TABS
100.0000 mg | ORAL_TABLET | Freq: Two times a day (BID) | ORAL | Status: DC
  Filled 2015-06-12 (×4): qty 1

## 2015-06-12 MED ORDER — MONTELUKAST SODIUM 10 MG PO TABS: 10.0000 mg | ORAL_TABLET | Freq: Every day | ORAL | Status: DC

## 2015-06-12 MED ORDER — PANTOPRAZOLE SODIUM 40 MG PO TBEC: 80.0000 mg | DELAYED_RELEASE_TABLET | Freq: Every day | ORAL | Status: DC

## 2015-06-12 MED ORDER — VALPROATE SODIUM 250 MG/5ML PO SYRP
125.0000 mg | ORAL_SOLUTION | Freq: Two times a day (BID) | ORAL | Status: DC
  Filled 2015-06-12 (×4): qty 2.5

## 2015-06-12 MED ORDER — LORAZEPAM 2 MG/ML IJ SOLN
1.0000 mg | INTRAMUSCULAR | Status: DC | PRN
  Administered 2015-06-12 – 2015-06-13 (×3): 1 mg via INTRAVENOUS
  Filled 2015-06-12 (×4): qty 1

## 2015-06-12 MED ORDER — PANTOPRAZOLE SODIUM 40 MG IV SOLR
40.0000 mg | INTRAVENOUS | Status: DC
  Administered 2015-06-12: 40 mg via INTRAVENOUS
  Filled 2015-06-12: qty 40

## 2015-06-12 MED ORDER — DIVALPROEX SODIUM 125 MG PO CSDR: 125.0000 mg | DELAYED_RELEASE_CAPSULE | Freq: Two times a day (BID) | ORAL | Status: DC

## 2015-06-12 MED ORDER — FAMOTIDINE 20 MG PO TABS: 40.0000 mg | ORAL_TABLET | Freq: Every day | ORAL | Status: DC

## 2015-06-12 MED ORDER — OLANZAPINE 2.5 MG PO TABS
2.5000 mg | ORAL_TABLET | Freq: Every day | ORAL | Status: DC
  Filled 2015-06-12 (×2): qty 1

## 2015-06-12 MED ORDER — DONEPEZIL HCL 5 MG PO TABS: 10.0000 mg | ORAL_TABLET | Freq: Every day | ORAL | Status: DC

## 2015-06-12 MED ORDER — DONEPEZIL HCL 10 MG PO TABS: 10.0000 mg | ORAL_TABLET | Freq: Every day | ORAL | Status: DC

## 2015-06-12 MED ORDER — LORAZEPAM 2 MG/ML IJ SOLN
0.5000 mg | INTRAMUSCULAR | Status: DC | PRN
  Administered 2015-06-12: 0.5 mg via INTRAVENOUS
  Filled 2015-06-12: qty 1

## 2015-06-12 NOTE — ED Provider Notes (Signed)
Seen by ENT He has no airway foreign body Pt stable Will be admitted to medical service   Don Rhineonald Alessandria Henken, MD 06/12/15 [phone]

## 2015-06-12 NOTE — Consult Note (Signed)
Reason for Consult:stridor Referring Physician: er  Don Garcia is an 69 y.o. male.  HPI: hx of slow neurologic disorder and on antipsychotic drugs that recently been recently adjusted. He has new increased leg movement and drier mouth, he has been nonverbal since October. He has aspiration previous. The family states there was not the beginning of this activity during eating. He has had what sounded like stridor and when he would have these episodes he seemed more sleepy and eyes closed. He has stopped the stridor sound over the last 45 minutes. I observe the stridor once in ED it indeed become more sedated like and his tongue looked like it moved toward the back of his throat. It didn't sound like true stridor rather just a exaggerated snoring sound.  Past Medical History  Diagnosis Date  . Hypertension   . COPD (chronic obstructive pulmonary disease) ([place])   . Hyperlipidemia   . Depression   . Anxiety   . GERD (gastroesophageal reflux disease)   . Barrett esophagus   . Diverticulosis     Past Surgical History  Procedure Laterality Date  . Lumbar laminectomy  1988  . Back fusion  2003  . Knee arthroscopy  1998    bilateral  . Nasal sinus surgery  1985    Family History  Problem Relation Age of Onset  . Colon cancer Neg Hx   . Stomach cancer Neg Hx   . Diabetes Paternal Uncle   . Diabetes Maternal Grandfather   . Renal cancer Paternal Uncle   . Lung disease Mother     Social History:  reports that he has never smoked. He has never used smokeless tobacco. He reports that he does not drink alcohol or use illicit drugs.  Allergies:  Allergies  Allergen Reactions  . Celebrex [Celecoxib]     lethargy  . Vioxx [Rofecoxib]     lethargy    Medications: I have reviewed the patient's current medications.  Results for orders placed or performed during the hospital encounter of 06/11/15 (from the past 48 hour(s))  Urine rapid drug screen (hosp performed)not at [facility]      Status: Abnormal   Collection Time: 06/11/15  7:30 PM  Result Value Ref Range   Opiates NONE DETECTED NONE DETECTED   Cocaine NONE DETECTED NONE DETECTED   Benzodiazepines POSITIVE (A) NONE DETECTED   Amphetamines NONE DETECTED NONE DETECTED   Tetrahydrocannabinol NONE DETECTED NONE DETECTED   Barbiturates NONE DETECTED NONE DETECTED    Comment:        DRUG SCREEN FOR MEDICAL PURPOSES ONLY.  IF CONFIRMATION IS NEEDED FOR ANY PURPOSE, NOTIFY LAB WITHIN 5 DAYS.        LOWEST DETECTABLE LIMITS FOR URINE DRUG SCREEN Drug Class       Cutoff (ng/mL) Amphetamine      1000 Barbiturate      200 Benzodiazepine   115 Tricyclics       726 Opiates          300 Cocaine          300 THC              50   Urinalysis, Routine w reflex microscopic (not at [facility])     Status: Abnormal   Collection Time: 06/11/15  7:30 PM  Result Value Ref Range   Color, Urine YELLOW YELLOW   APPearance HAZY (A) CLEAR   Specific Gravity, Urine 1.026 1.005 - 1.030   pH 7.0 5.0 - 8.0  Glucose, UA NEGATIVE NEGATIVE mg/dL   Hgb urine dipstick LARGE (A) NEGATIVE   Bilirubin Urine NEGATIVE NEGATIVE   Ketones, ur NEGATIVE NEGATIVE mg/dL   Protein, ur NEGATIVE NEGATIVE mg/dL   Nitrite NEGATIVE NEGATIVE   Leukocytes, UA TRACE (A) NEGATIVE  Urine microscopic-add on     Status: Abnormal   Collection Time: 06/11/15  7:30 PM  Result Value Ref Range   Squamous Epithelial / LPF 0-5 (A) NONE SEEN   WBC, UA 0-5 0 - 5 WBC/hpf   RBC / HPF 6-30 0 - 5 RBC/hpf   Bacteria, UA RARE (A) NONE SEEN   Urine-Other MUCOUS PRESENT   CBG monitoring, ED     Status: None   Collection Time: 06/11/15  7:51 PM  Result Value Ref Range   Glucose-Capillary 88 65 - 99 mg/dL   Comment 1 Notify RN    Comment 2 Glucose Stabilizer    Comment 3 Document in Chart   CBC     Status: None   Collection Time: 06/11/15  7:59 PM  Result Value Ref Range   WBC 6.8 4.0 - 10.5 K/uL   RBC 4.67 4.22 - 5.81 MIL/uL   Hemoglobin 14.1 13.0 - 17.0 g/dL    HCT 41.4 39.0 - 52.0 %   MCV 88.7 78.0 - 100.0 fL   MCH 30.2 26.0 - 34.0 pg   MCHC 34.1 30.0 - 36.0 g/dL   RDW 13.8 11.5 - 15.5 %   Platelets 163 150 - 400 K/uL  Differential     Status: None   Collection Time: 06/11/15  7:59 PM  Result Value Ref Range   Neutrophils Relative % 65 %   Neutro Abs 4.4 1.7 - 7.7 K/uL   Lymphocytes Relative 23 %   Lymphs Abs 1.6 0.7 - 4.0 K/uL   Monocytes Relative 9 %   Monocytes Absolute 0.6 0.1 - 1.0 K/uL   Eosinophils Relative 3 %   Eosinophils Absolute 0.2 0.0 - 0.7 K/uL   Basophils Relative 0 %   Basophils Absolute 0.0 0.0 - 0.1 K/uL  Comprehensive metabolic panel     Status: Abnormal   Collection Time: 06/11/15  7:59 PM  Result Value Ref Range   Sodium 141 135 - 145 mmol/L   Potassium 4.1 3.5 - 5.1 mmol/L   Chloride 107 101 - 111 mmol/L   CO2 27 22 - 32 mmol/L   Glucose, Bld 97 65 - 99 mg/dL   BUN 20 6 - 20 mg/dL   Creatinine, Ser 1.04 0.61 - 1.24 mg/dL   Calcium 9.0 8.9 - 10.3 mg/dL   Total Protein 5.9 (L) 6.5 - 8.1 g/dL   Albumin 3.4 (L) 3.5 - 5.0 g/dL   AST 25 15 - 41 U/L   ALT 43 17 - 63 U/L   Alkaline Phosphatase 100 38 - 126 U/L   Total Bilirubin 0.4 0.3 - 1.2 mg/dL   GFR calc non Af Amer >60 >60 mL/min   GFR calc Af Amer >60 >60 mL/min    Comment: (NOTE) The eGFR has been calculated using the CKD EPI equation. This calculation has not been validated in all clinical situations. eGFR's persistently <60 mL/min signify possible Chronic Kidney Disease.    Anion gap 7 5 - 15  CK     Status: None   Collection Time: 06/11/15  7:59 PM  Result Value Ref Range   Total CK 95 49 - 397 U/L  Valproic acid level     Status: Abnormal  Collection Time: 06/11/15  7:59 PM  Result Value Ref Range   Valproic Acid Lvl 32 (L) 50.0 - 100.0 ug/mL  Ammonia     Status: Abnormal   Collection Time: 06/11/15  8:00 PM  Result Value Ref Range   Ammonia 38 (H) 9 - 35 umol/L    Ct Head Wo Contrast  06/11/2015  CLINICAL DATA:  Patient with  confusion. History of Parkinson's. Seizure like activity. EXAM: CT HEAD WITHOUT CONTRAST TECHNIQUE: Contiguous axial images were obtained from the base of the skull through the vertex without intravenous contrast. COMPARISON:  Brain CT 02/17/2015 FINDINGS: Lateral ventricles are prominent, stable from prior. No evidence for acute cortically based infarct, intracranial hemorrhage, mass lesion mass-effect. Orbits are unremarkable. Soft tissues are unremarkable. Mucosal thickening within the ethmoid air cells. Air-fluid level within the sphenoid sinus. Mastoid air cells are unremarkable. IMPRESSION: No acute intracranial process. Stable prominence of lateral ventricles bilaterally. Findings suggestive of paranasal sinusitis. Electronically Signed   By: Lovey Newcomer M.D.   On: 06/11/2015 21:16   Dg Chest Portable 1 View  06/11/2015  CLINICAL DATA:  Confusion EXAM: PORTABLE CHEST 1 VIEW COMPARISON:  02/20/2015 chest radiograph. FINDINGS: Slightly low lung volumes. Stable cardiomediastinal silhouette with top-normal heart size. No pneumothorax. No pleural effusion. Vascular crowding without overt pulmonary edema. No focal lung consolidation. IMPRESSION: Low lung volumes with no active cardiopulmonary disease. Electronically Signed   By: Ilona Sorrel M.D.   On: 06/11/2015 20:26    ROS Blood pressure 139/98, pulse 61, temperature 97.7 F (36.5 C), temperature source Rectal, resp. rate 24, SpO2 95 %. Physical Exam  HENT:  He is awake but not alert. He opens eyes but doesn't seem to be aware. He is moving in a dyskinesia fashion of the extremities. Tongue is also with dyskinetic movement and OC is excessively dry. FOE- NP clear. Thick mucus in the posterior pharynx. Larynx is wide open and TVC move normal. The subglottis is clear and there is some narrowing in the trachea seen only from above the cords from trach site. No foreign bodies or food material seen. Neck no mass or lesion. Trach scar     Assessment/Plan: Stridor-this problem seems to have for the most part resolved at this point. Is not clear what the etiology of that is but it seems more of a functional/neurologic issue possibly from medication. He has excessive dry mouth indicating medication effects. The vocal cords move normally and there is no evidence of any foreign body or food material identified on fiberoptic exam. If he has any further airway difficulty please call otherwise I don't have any recommendations at this point  Melissa Montane 06/12/2015, 12:05 AM

## 2015-06-12 NOTE — Care Management Note (Signed)
Case Management Note  Patient Details  Name: Don BarrierMichael L Garcia MRN: [id number] Date of Birth: 22-Jul-1945  Subjective/Objective:     Patient admitted with acute encephalopathy. Patient with Alzheimers dementia and from [facility].                Action/Plan: Awaiting further testing. CM will continue to follow for discharge needs.   Expected Discharge Date:                  Expected Discharge Plan:     In-House Referral:     Discharge planning Services     Post Acute Care Choice:    Choice offered to:     DME Arranged:    DME Agency:     HH Arranged:    HH Agency:     Status of Service:  In process, will continue to follow  Medicare Important Message Given:    Date Medicare IM Given:    Medicare IM give by:    Date Additional Medicare IM Given:    Additional Medicare Important Message give by:     If discussed at Long Length of Stay Meetings, dates discussed:    Additional Comments:  Kermit BaloKelli F Eloise Picone, RN 06/12/2015, 10:26 AM

## 2015-06-12 NOTE — H&P (Signed)
[facility] History and Physical  KEYLER HOGE YQM:[id number] DOB: [birth year] DOA: 06/11/2015  Referring physician: EDP PCP: Ailene Ravel, MD   Chief Complaint: AMS   HPI: Don Garcia is a 69 y.o. male with h/o Alzheimer's type dementia (clinical diagnosis by Dr. Adella Hare in [place]).  Patient presents to the ED with abnormal movements, increased confusion, and apparent difficulty breathing.  All of these new findings onset subsequent to eating a meal apparently, there is subsequent question of seizure activity though it isnt clear if he had true tonic-clonic seizure.  What is apparent is that he is having intermittent purposeful movement of all 4 extremities with preserved consciousness, he is following simple 1 step commands, but unable to answer yes/no questions.  He has had progressive difficulty swallowing over the past 6 months.  Worsening difficulty with speech he is now unable to form word output since September.  Barium swallow in august had both silent and sensed aspiration during that study.  After eval by GI and SLP he was started on thickened liquids and mechanical soft diet.  Of note patients presentation was similar in September when he aspirated.  Patient is unable to feed himself at baseline.  Review of Systems: unable to perform, patient following commands but not answering questions.  Past Medical History  Diagnosis Date  . Hypertension   . COPD (chronic obstructive pulmonary disease) (HCC)   . Hyperlipidemia   . Depression   . Anxiety   . GERD (gastroesophageal reflux disease)   . Barrett esophagus   . Diverticulosis    Past Surgical History  Procedure Laterality Date  . Lumbar laminectomy  1988  . Back fusion  2003  . Knee arthroscopy  1998    bilateral  . Nasal sinus surgery  1985   Social History:  reports that he has never smoked. He has never used smokeless tobacco. He reports that he does not drink alcohol or use illicit  drugs.  Allergies  Allergen Reactions  . Celebrex [Celecoxib]     lethargy  . Vioxx [Rofecoxib]     lethargy    Family History  Problem Relation Age of Onset  . Colon cancer Neg Hx   . Stomach cancer Neg Hx   . Diabetes Paternal Uncle   . Diabetes Maternal Grandfather   . Renal cancer Paternal Uncle   . Lung disease Mother      Prior to Admission medications   Medication Sig Start Date End Date Taking? Authorizing Provider  ALPRAZolam Prudy Feeler) 0.5 MG tablet Take 0.5 mg by mouth at bedtime as needed for anxiety.   Yes Historical Provider, MD  amLODipine (NORVASC) 5 MG tablet Take 5 mg by mouth daily.  12/17/11  Yes Historical Provider, MD  aspirin 81 MG tablet Take 81 mg by mouth daily.   Yes Historical Provider, MD  benztropine (COGENTIN) 1 MG tablet Take 1 mg by mouth daily.   Yes Historical Provider, MD  buPROPion (WELLBUTRIN) 100 MG tablet Take 100 mg by mouth 2 (two) times daily.   Yes Historical Provider, MD  desmopressin (DDAVP) 0.2 MG tablet Take 0.2 mg by mouth 2 (two) times daily.   Yes Historical Provider, MD  divalproex (DEPAKOTE SPRINKLE) 125 MG capsule Take 125 mg by mouth 2 (two) times daily.   Yes Historical Provider, MD  donepezil (ARICEPT) 10 MG tablet Take 10 mg by mouth at bedtime.   Yes Historical Provider, MD  fluticasone (FLONASE) 50 MCG/ACT nasal spray Place 1 spray into both  nostrils daily.   Yes Historical Provider, MD  lovastatin (MEVACOR) 20 MG tablet Take 20 mg by mouth at bedtime.   Yes Historical Provider, MD  meloxicam (MOBIC) 15 MG tablet Take 15 mg by mouth daily.   Yes Historical Provider, MD  metoprolol succinate (TOPROL-XL) 50 MG 24 hr tablet Take 50 mg by mouth daily. Take with or immediately following a meal.   Yes Historical Provider, MD  Multiple Vitamin (MULTIVITAMIN) capsule Take 1 capsule by mouth daily.   Yes Historical Provider, MD  OLANZapine (ZYPREXA) 2.5 MG tablet Take 2.5 mg by mouth at bedtime.   Yes Historical Provider, MD   omeprazole (PRILOSEC) 40 MG capsule Take 1 capsule (40 mg total) by mouth 2 (two) times daily. 01/24/15  Yes Lori P Hvozdovic, PA-C  pramipexole (MIRAPEX) 0.5 MG tablet Take 0.5 mg by mouth 2 (two) times daily.   Yes Historical Provider, MD  ranitidine (ZANTAC) 300 MG tablet Take 300 mg by mouth at bedtime.   Yes Historical Provider, MD  rOPINIRole (REQUIP) 1 MG tablet Take 1 mg by mouth at bedtime.    Yes Historical Provider, MD  sertraline (ZOLOFT) 100 MG tablet Take 100 mg by mouth 2 (two) times daily.  10/13/11  Yes Historical Provider, MD  temazepam (RESTORIL) 15 MG capsule Take 15 mg by mouth at bedtime as needed for sleep.   Yes Historical Provider, MD  montelukast (SINGULAIR) 10 MG tablet Take 10 mg by mouth at bedtime.    Historical Provider, MD   Physical Exam: Filed Vitals:   06/11/15 2330 06/12/15 0000  BP:    Pulse: 73 62  Temp:    Resp:      BP 139/98 mmHg  Pulse 62  Temp(Src) 97.7 F (36.5 C) (Rectal)  Resp 24  SpO2 96%  General Appearance:    Awake, Mild distress, appears stated age  Head:    Normocephalic, atraumatic  Eyes:    PERRL, EOMI, sclera non-icteric        Nose:   Nares without drainage or epistaxis. Mucosa, turbinates normal  Throat:   Dry mucous membranes. Oropharynx without erythema or exudate.  Neck:   Supple. No carotid bruits.  No thyromegaly.  No lymphadenopathy.   Back:     No CVA tenderness, no spinal tenderness  Lungs:     Clear to auscultation bilaterally, without wheezes, rhonchi or rales  Chest wall:    No tenderness to palpitation  Heart:    Regular rate and rhythm without murmurs, gallops, rubs  Abdomen:     Soft, non-tender, nondistended, normal bowel sounds, no organomegaly  Genitalia:    deferred  Rectal:    deferred  Extremities:   No clubbing, cyanosis or edema.  Pulses:   2+ and symmetric all extremities  Skin:   Skin color, texture, turgor normal, no rashes or lesions  Lymph nodes:   Cervical, supraclavicular, and axillary nodes  normal  Neurologic:   Follows commands, profound bulbar dysfunction, unable to answer questions, even yes/no    Labs on Admission:  Basic Metabolic Panel:  Recent Labs Lab 06/11/15 1959  NA 141  K 4.1  CL 107  CO2 27  GLUCOSE 97  BUN 20  CREATININE 1.04  CALCIUM 9.0   Liver Function Tests:  Recent Labs Lab 06/11/15 1959  AST 25  ALT 43  ALKPHOS 100  BILITOT 0.4  PROT 5.9*  ALBUMIN 3.4*   No results for input(s): LIPASE, AMYLASE in the last 168 hours.  Recent  Labs Lab 06/11/15 2000  AMMONIA 38*   CBC:  Recent Labs Lab 06/11/15 1959  WBC 6.8  NEUTROABS 4.4  HGB 14.1  HCT 41.4  MCV 88.7  PLT 163   Cardiac Enzymes:  Recent Labs Lab 06/11/15 1959  CKTOTAL 95    BNP (last 3 results) No results for input(s): PROBNP in the last 8760 hours. CBG:  Recent Labs Lab 06/11/15 1951  GLUCAP 88    Radiological Exams on Admission: Ct Head Wo Contrast  06/11/2015  CLINICAL DATA:  Patient with confusion. History of Parkinson's. Seizure like activity. EXAM: CT HEAD WITHOUT CONTRAST TECHNIQUE: Contiguous axial images were obtained from the base of the skull through the vertex without intravenous contrast. COMPARISON:  Brain CT 02/17/2015 FINDINGS: Lateral ventricles are prominent, stable from prior. No evidence for acute cortically based infarct, intracranial hemorrhage, mass lesion mass-effect. Orbits are unremarkable. Soft tissues are unremarkable. Mucosal thickening within the ethmoid air cells. Air-fluid level within the sphenoid sinus. Mastoid air cells are unremarkable. IMPRESSION: No acute intracranial process. Stable prominence of lateral ventricles bilaterally. Findings suggestive of paranasal sinusitis. Electronically Signed   By: Annia Belt M.D.   On: 06/11/2015 21:16   Dg Chest Portable 1 View  06/11/2015  CLINICAL DATA:  Confusion EXAM: PORTABLE CHEST 1 VIEW COMPARISON:  02/20/2015 chest radiograph. FINDINGS: Slightly low lung volumes. Stable  cardiomediastinal silhouette with top-normal heart size. No pneumothorax. No pleural effusion. Vascular crowding without overt pulmonary edema. No focal lung consolidation. IMPRESSION: Low lung volumes with no active cardiopulmonary disease. Electronically Signed   By: Delbert Phenix M.D.   On: 06/11/2015 20:26    EKG: Independently reviewed.  Assessment/Plan Principal Problem:   Acute encephalopathy Active Problems:   Bulbar motor neuron disease (HCC)   Dysphagia   Alzheimer's dementia   1. Acute encephalopathy - 1. Suspicious that patient may have had aspiration event of some sort 2. Thankfully airway appears clear on laryngoscopy done by Dr. Jearld Fenton in the ED this evening and appears to have spontaneously have reduced distress from initial presentation. 3. SLP eval and treat 4. NPO until then 5. Have ordered KO feeding tube to be placed until then so we can continue home meds 6. Pharm consult to convert meds to per tube / IV 2. Bulbar motor neuron disease - 1. Appears to have something in addition to alzheimer's going on 2. MRI brain 3. EEG 4. See Dr. Alene Mires note    Code Status: Full for now per family, they are going to be discussing this, feeding tube desire, etc.  Family Communication: Family at bedside Disposition Plan: Admit to inpatient   Time spent: 70 min  GARDNER, JARED M. [facility] Pager [phone]  If 7AM-7PM, please contact the day team taking care of the patient [email or web address] Password [email or web address] 06/12/2015, 12:21 AM
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
## 2015-06-12 NOTE — Consult Note (Signed)
Consultation Note Date: 06/12/2015   Patient Name: Don Garcia  DOB: 06-Feb-1946  MRN: [id number]  Age / Sex: 69 y.o., male  PCP: Ailene Ravel, MD Referring Physician: Maretta Bees, MD  Reason for Consultation: Establishing goals of care    Clinical Assessment/Narrative:  Patient is a 69 year old male with a diagnosis of mild Alzheimer's dementia who has been having progressive dysphagia, dysarthria progressing to anarthria for the past 6-12 months. Patient's wife is the primary historian present at the bedside who states that the patient was hospitalized for pneumonia in [place] in September 2016. Since then he has been residing at a skilled nursing facility. Patient has had gradual progressive subacute decline. He has had worsening swallowing difficulty and worsening speech for the past several months. Additionally, he has developed restless movements.  Patient has been seen and evaluated by neurology. 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 It is noted that he has a history of Alzheimer's dementia and possible aspiration events in the past. Palliative care consult for goals of care discussions.  Patient is awake but not alert resting in bed. His family is present at the bedside. The patient has frequent restless nonpurposeful movements/ thrashing about of his upper and lower extremities. Patient's wife is present at the bedside. The patient has 1 son. Patient's daughter-in-law, grandson, nephew, 2 sisters and the patient's preacher also present at the bedside.  CODE STATUS discussions undertaken. The patient's wife and all other family members agree and endorsed that the patient would not want intubation mechanical ventilation or CPR or ACLS protocol. CODE STATUS is hence clarified as DO NOT RESUSCITATE/DO NOT INTUBATE. Additionally, discussions undertaken  about scope of artificial nutrition and hydration. With the patient's restlessness/agitation, family does not believe that a PEG tube insertion would be in the patient's best interest especially if he has progressive neurological deterioration as is being presumed.  Discussed about appropriate symptom management with using Ativan IV when necessary for his restless movements. His cussed MRI results showing advance a trophy. EEG results are pending. Family appreciative of neurology and hospital medicine follow-up.  Discussed with family about possible need for adding hospice as an extra layer of support at the patient continues to show rapid progressive decline in even in this hospitalization. Also discussed about what type of care can be provided in hospice homes. Discussed that at this time it is difficult to prognosticate but that the patient's prognosis does appear guarded. Palliative will continue to follow along and help guide appropriate symptom management as well as disposition planning. All questions answered to the best of my ability.  Contacts/Participants in Discussion: Primary Decision Maker:    wife Relationship to Patient  wife HCPOA: yes  Wife, he has a son.  SUMMARY OF RECOMMENDATIONS: DNR DNI Ativan IV PRN for restless movements Progressive dysphagia in the setting of possible motor neuron disease: no PEG Continue to follow hospital course. If no improvement, then have discussed hospice consult and possible need for transfer to inpatient hospice with family.  Appreciate family preacher following up with the patient in the hospital.    Code Status/Advance Care Planning: DNR    Code Status Orders        Start     Ordered   06/12/15 1612  Do not attempt resuscitation (DNR)   Continuous    Question Answer Comment  In the event of cardiac or respiratory ARREST Do not call a "code blue"   In the  event of cardiac or respiratory ARREST Do not perform Intubation, CPR,  defibrillation or ACLS   In the event of cardiac or respiratory ARREST Use medication by any route, position, wound care, and other measures to relive pain and suffering. May use oxygen, suction and manual treatment of airway obstruction as needed for comfort.      06/12/15 1612      Other Directives:Other  Symptom Management:    Ativan IV PRN  Palliative Prophylaxis:   Delirium Protocol  Additional Recommendations (Limitations, Scope, Preferences):  continue current hospital course, transition to comfort care if ongoing decline     Psycho-social/Spiritual:  Support System: [place] for further Chaplaincy support:yes Additional Recommendations: Caregiving  Support/Resources  Prognosis: < 4 weeks  Discharge Planning: pending hospital course, but discussed with family about likelihood of needing hospice support   Chief Complaint/ Primary Diagnoses: Present on Admission:  . Acute encephalopathy . Bulbar motor neuron disease (HCC) . Alzheimer's dementia  I have reviewed the medical record, interviewed the patient and family, and examined the patient. The following aspects are pertinent.  Past Medical History  Diagnosis Date  . Hypertension   . COPD (chronic obstructive pulmonary disease) (HCC)   . Hyperlipidemia   . Depression   . Anxiety   . GERD (gastroesophageal reflux disease)   . Barrett esophagus   . Diverticulosis    Social History   Social History  . Marital Status: Married    Spouse Name: N/A  . Number of Children: 1  . Years of Education: N/A   Occupational History  . Disabled     used to work making gas pumps   Social History Main Topics  . Smoking status: Never Smoker   . Smokeless tobacco: Never Used  . Alcohol Use: No  . Drug Use: No  . Sexual Activity: Not Asked   Other Topics Concern  . None   Social History Narrative   Family History  Problem Relation Age of Onset  . Colon cancer Neg Hx   . Stomach cancer Neg Hx   .  Diabetes Paternal Uncle   . Diabetes Maternal Grandfather   . Renal cancer Paternal Uncle   . Lung disease Mother    Scheduled Meds: . amLODipine  5 mg Per Tube Daily  . aspirin  81 mg Per Tube Daily  . benztropine  1 mg Per Tube Daily  . buPROPion  100 mg Per Tube BID  . desmopressin  0.2 mg Per Tube BID  . donepezil  10 mg Per Tube QHS  . fluticasone  1 spray Each Nare Daily  . heparin  5,000 Units Subcutaneous 3 times per day  . meloxicam  15 mg Per Tube Daily  . metoprolol tartrate  25 mg Per Tube BID  . montelukast  10 mg Per Tube QHS  . [START ON 06/13/2015] multivitamin with minerals  1 tablet Per Tube Daily  . OLANZapine  2.5 mg Per Tube QHS  . pantoprazole (PROTONIX) IV  40 mg Intravenous Q24H  . pramipexole  0.5 mg Oral BID  . pravastatin  20 mg Per Tube q1800  . ranitidine  300 mg Per Tube QHS  . rOPINIRole  1 mg Per Tube QHS  . sertraline  100 mg Per Tube BID  . valproic acid  125 mg Per Tube BID   Continuous Infusions: . sodium chloride 75 mL/hr at 06/12/15 1228   PRN Meds:.hydrALAZINE, LORazepam, temazepam Medications Prior to Admission:  Prior to Admission medications  Medication Sig Start Date End Date Taking? Authorizing Provider  ALPRAZolam Prudy Feeler(XANAX) 0.5 MG tablet Take 0.5 mg by mouth at bedtime as needed for anxiety.   Yes Historical Provider, MD  amLODipine (NORVASC) 5 MG tablet Take 5 mg by mouth daily.  12/17/11  Yes Historical Provider, MD  aspirin 81 MG tablet Take 81 mg by mouth daily.   Yes Historical Provider, MD  benztropine (COGENTIN) 1 MG tablet Take 1 mg by mouth daily.   Yes Historical Provider, MD  buPROPion (WELLBUTRIN) 100 MG tablet Take 100 mg by mouth 2 (two) times daily.   Yes Historical Provider, MD  desmopressin (DDAVP) 0.2 MG tablet Take 0.2 mg by mouth 2 (two) times daily.   Yes Historical Provider, MD  divalproex (DEPAKOTE SPRINKLE) 125 MG capsule Take 125 mg by mouth 2 (two) times daily.   Yes Historical Provider, MD  donepezil  (ARICEPT) 10 MG tablet Take 10 mg by mouth at bedtime.   Yes Historical Provider, MD  fluticasone (FLONASE) 50 MCG/ACT nasal spray Place 1 spray into both nostrils daily.   Yes Historical Provider, MD  lovastatin (MEVACOR) 20 MG tablet Take 20 mg by mouth at bedtime.   Yes Historical Provider, MD  meloxicam (MOBIC) 15 MG tablet Take 15 mg by mouth daily.   Yes Historical Provider, MD  metoprolol succinate (TOPROL-XL) 50 MG 24 hr tablet Take 50 mg by mouth daily. Take with or immediately following a meal.   Yes Historical Provider, MD  Multiple Vitamin (MULTIVITAMIN) capsule Take 1 capsule by mouth daily.   Yes Historical Provider, MD  OLANZapine (ZYPREXA) 2.5 MG tablet Take 2.5 mg by mouth at bedtime.   Yes Historical Provider, MD  omeprazole (PRILOSEC) 40 MG capsule Take 1 capsule (40 mg total) by mouth 2 (two) times daily. 01/24/15  Yes Lori P Hvozdovic, PA-C  pramipexole (MIRAPEX) 0.5 MG tablet Take 0.5 mg by mouth 2 (two) times daily.   Yes Historical Provider, MD  ranitidine (ZANTAC) 300 MG tablet Take 300 mg by mouth at bedtime.   Yes Historical Provider, MD  rOPINIRole (REQUIP) 1 MG tablet Take 1 mg by mouth at bedtime.    Yes Historical Provider, MD  sertraline (ZOLOFT) 100 MG tablet Take 100 mg by mouth 2 (two) times daily.  10/13/11  Yes Historical Provider, MD  temazepam (RESTORIL) 15 MG capsule Take 15 mg by mouth at bedtime as needed for sleep.   Yes Historical Provider, MD  montelukast (SINGULAIR) 10 MG tablet Take 10 mg by mouth at bedtime.    Historical Provider, MD   Allergies  Allergen Reactions  . Celebrex [Celecoxib]     lethargy  . Vioxx [Rofecoxib]     lethargy    Review of Systems Positive for ongoing weakness, difficulty swallowing, worsening speech for past several months, history obtained through family members present at the bedside.   Physical Exam Acute distress, restless non purposeful movements, do not appear to be seizure related.  S1 S2 Chest clear  anteriorly Abdomen soft No edema Opens eyes, does follow some commands for his family members. Does not verbalize.   Vital Signs: BP 148/66 mmHg  Pulse 52  Temp(Src) 98.1 F (36.7 C) (Axillary)  Resp 28  Ht 5\' 8"  (1.727 m)  Wt 85.322 kg (188 lb 1.6 oz)  BMI 28.61 kg/m2  SpO2 98%  SpO2: SpO2: 98 % O2 Device:SpO2: 98 % O2 Flow Rate: .O2 Flow Rate (L/min): 2 L/min  IO: Intake/output summary: No intake or output data  in the 24 hours ending 06/12/15 1629  LBM: Last BM Date: 06/12/15 Baseline Weight: Weight: 85.322 kg (188 lb 1.6 oz) Most recent weight: Weight: 85.322 kg (188 lb 1.6 oz)      Palliative Assessment/Data:  Flowsheet Rows        Most Recent Value   Intake Tab    Referral Department  Hospitalist   Unit at Time of Referral  Med/Surg Unit   Palliative Care Primary Diagnosis  Neurology   Palliative Care Type  New Palliative care   Reason for referral  Non-pain Symptom, Clarify Goals of Care   Date first seen by Palliative Care  06/12/15   Clinical Assessment    Palliative Performance Scale Score  20%   Pain Max last 24 hours  3   Pain Min Last 24 hours  2   Nausea Min Last 24 Hours  3   Anxiety Max Last 24 Hours  2   Psychosocial & Spiritual Assessment    Palliative Care Outcomes    Patient/Family meeting held?  Yes   Who was at the meeting?  wife, sisters, daughter in law, nephew, grand son,    Palliative Care Outcomes  Clarified goals of care, Improved non-pain symptom therapy, Counseled regarding hospice   Palliative Care follow-up planned  Yes, Facility      Additional Data Reviewed:  CBC:    Component Value Date/Time   WBC 6.8 06/11/2015 1959   HGB 14.1 06/11/2015 1959   HCT 41.4 06/11/2015 1959   PLT 163 06/11/2015 1959   MCV 88.7 06/11/2015 1959   NEUTROABS 4.4 06/11/2015 1959   LYMPHSABS 1.6 06/11/2015 1959   MONOABS 0.6 06/11/2015 1959   EOSABS 0.2 06/11/2015 1959   BASOSABS 0.0 06/11/2015 1959   Comprehensive Metabolic Panel:      Component Value Date/Time   NA 141 06/11/2015 1959   K 4.1 06/11/2015 1959   CL 107 06/11/2015 1959   CO2 27 06/11/2015 1959   BUN 20 06/11/2015 1959   CREATININE 1.04 06/11/2015 1959   GLUCOSE 97 06/11/2015 1959   CALCIUM 9.0 06/11/2015 1959   AST 25 06/11/2015 1959   ALT 43 06/11/2015 1959   ALKPHOS 100 06/11/2015 1959   BILITOT 0.4 06/11/2015 1959   PROT 5.9* 06/11/2015 1959   ALBUMIN 3.4* 06/11/2015 1959     Time In: 1530 Time Out: 1630 Time Total: 60 min  Greater than 50%  of this time was spent counseling and coordinating care related to the above assessment and plan.  Signed by: Rosalin Hawking, MD [phone] Rosalin Hawking, MD  06/12/2015, 4:29 PM  Please contact Palliative Medicine Team phone at [phone] for questions and concerns.

## 2015-06-12 NOTE — Progress Notes (Signed)
Contacted the RN in regards to the [place] tube placement order. RN stated that per MD they would like to hold off at this time until the decisions have been made per family. Instructed the RN that if plans change to please contact as and we will move forward with order. Will wait to hear from RN or MD.

## 2015-06-12 NOTE — ED Notes (Signed)
Report attempted 

## 2015-06-12 NOTE — Progress Notes (Signed)
PATIENT DETAILS Name: Don Garcia Age: 69 y.o. Sex: male Date of Birth: 1945-07-22 Admit Date: 06/11/2015 Admitting Physician Hillary Bow, DO ZOX:[id number],UJWJX L, MD  Subjective: Awake-follows commands-appears to have severe dysarthria and dysphagia.  Assessment/Plan: Principal Problem: Progressive bulbar dysfunction with worsening dysphagia, dysarthria: MRI brain negative for acute abnormalities, EEG negative for seizures. Appears to have severe bulbar dysfunction-per neurology motor neuron diseases in the differentials. Seen by speech therapy today-recommendations are to keep nothing by mouth. Spoke with family-spouse over the phone-apparently has had aspiration issues since this past October and has progressively declined since then. For now keep nothing by mouth, spoke with spouse regarding placing a PEG/NG tube versus Johny Shock measures-she will think about different options. Have consulted palliative care, await further recommendations from neurology.  Active Problems: ?Stridor: Seen by ENT-no further recommendations at this point-underwent laryngoscopy without major findings.  Hypertension: Blood pressure currently controlled-without the use of antihypertensives-nothing by mouth because of severe dysphagia-we will use as needed hydralazine.  Depression: Continue to hold antidepressants for now  GERD:PPI  Palliative care: Unfortunate 69 year old with progressive bulbar dysfunction with severe dysphagia/dysarthria-some suspicion for motor neuron disease. Dysphagia. Appears to have severe oropharyngeal dysphagia on initial evaluation by SLP-recommendations are for nothing by mouth status. Briefly spoke with patient's spouse over the phone-explained options for artificial feeding via NG/PEG tube versus hospice/palliative care-she will think about options- I will d/w her tomorrow-keep NPO for now.Spouse aware of poor overall prognosis at this point. I have also  consulted palliative care.  Disposition: Remain inpatient  Antimicrobial agents  See below  Anti-infectives    None      DVT Prophylaxis: Prophylactic Heparin   Code Status: Full code   Family Communication Spouse-  Procedures: nONE  CONSULTS:  neurology  Time spent 40 minutes-Greater than 50% of this time was spent in counseling, explanation of diagnosis, planning of further management, and coordination of care.  MEDICATIONS: Scheduled Meds: . amLODipine  5 mg Per Tube Daily  . aspirin  81 mg Per Tube Daily  . benztropine  1 mg Per Tube Daily  . buPROPion  100 mg Per Tube BID  . desmopressin  0.2 mg Per Tube BID  . donepezil  10 mg Per Tube QHS  . fluticasone  1 spray Each Nare Daily  . heparin  5,000 Units Subcutaneous 3 times per day  . meloxicam  15 mg Per Tube Daily  . metoprolol tartrate  25 mg Per Tube BID  . montelukast  10 mg Per Tube QHS  . multivitamin with minerals  1 tablet Oral Daily  . OLANZapine  2.5 mg Per Tube QHS  . pantoprazole  80 mg Oral Daily  . pramipexole  0.5 mg Oral BID  . pravastatin  20 mg Per Tube q1800  . ranitidine  300 mg Per Tube QHS  . rOPINIRole  1 mg Per Tube QHS  . sertraline  100 mg Per Tube BID  . valproic acid  125 mg Per Tube BID   Continuous Infusions: . sodium chloride 125 mL/hr at 06/12/15 0352   PRN Meds:.ALPRAZolam, temazepam    PHYSICAL EXAM: Vital signs in last 24 hours: Filed Vitals:   06/12/15 0000 06/12/15 0040 06/12/15 0215 06/12/15 0600  BP:  129/99 151/90 148/66  Pulse: 62 65 62 52  Temp:   98.6 F (37 C) 98.1 F (36.7 C)  TempSrc:   Axillary Axillary  Resp:  24 28  Weight:   85.322 kg (188 lb 1.6 oz)   SpO2: 96% 94% 100% 98%    Weight change:  Filed Weights   06/12/15 0215  Weight: 85.322 kg (188 lb 1.6 oz)   Body mass index is 28.18 kg/(m^2).   Gen Exam: Awake, follows commands-squeezes my fingers-moves extremities on commands.   Neck: Supple, No JVD.   Chest: B/L Clear  anteriorly   CVS: S1 S2 Regular, no murmurs.  Abdomen: soft, BS +, non tender, non distended.  Extremities: no edema, lower extremities warm to touch. Neurologic: Moves all 4 extremities-difficult exam-appears to have spontaneous myoclonic jerks. Hyperreflexia. Skin: No Rash.   Wounds: N/A.    Intake/Output from previous day: No intake or output data in the 24 hours ending 06/12/15 1132   LAB RESULTS: CBC  Recent Labs Lab 06/11/15 1959  WBC 6.8  HGB 14.1  HCT 41.4  PLT 163  MCV 88.7  MCH 30.2  MCHC 34.1  RDW 13.8  LYMPHSABS 1.6  MONOABS 0.6  EOSABS 0.2  BASOSABS 0.0    Chemistries   Recent Labs Lab 06/11/15 1959  NA 141  K 4.1  CL 107  CO2 27  GLUCOSE 97  BUN 20  CREATININE 1.04  CALCIUM 9.0    CBG:  Recent Labs Lab 06/11/15 1951  GLUCAP 88    GFR Estimated Creatinine Clearance: 74 mL/min (by C-G formula based on Cr of 1.04).  Coagulation profile No results for input(s): INR, PROTIME in the last 168 hours.  Cardiac Enzymes No results for input(s): CKMB, TROPONINI, MYOGLOBIN in the last 168 hours.  Invalid input(s): CK  Invalid input(s): POCBNP No results for input(s): DDIMER in the last 72 hours. No results for input(s): HGBA1C in the last 72 hours. No results for input(s): CHOL, HDL, LDLCALC, TRIG, CHOLHDL, LDLDIRECT in the last 72 hours. No results for input(s): TSH, T4TOTAL, T3FREE, THYROIDAB in the last 72 hours.  Invalid input(s): FREET3 No results for input(s): VITAMINB12, FOLATE, FERRITIN, TIBC, IRON, RETICCTPCT in the last 72 hours. No results for input(s): LIPASE, AMYLASE in the last 72 hours.  Urine Studies No results for input(s): UHGB, CRYS in the last 72 hours.  Invalid input(s): UACOL, UAPR, USPG, UPH, UTP, UGL, UKET, UBIL, UNIT, UROB, ULEU, UEPI, UWBC, URBC, UBAC, CAST, UCOM, BILUA  MICROBIOLOGY: No results found for this or any previous visit (from the past 240 hour(s)).  RADIOLOGY STUDIES/RESULTS: Ct Head Wo  Contrast  06/11/2015  CLINICAL DATA:  Patient with confusion. History of Parkinson's. Seizure like activity. EXAM: CT HEAD WITHOUT CONTRAST TECHNIQUE: Contiguous axial images were obtained from the base of the skull through the vertex without intravenous contrast. COMPARISON:  Brain CT 02/17/2015 FINDINGS: Lateral ventricles are prominent, stable from prior. No evidence for acute cortically based infarct, intracranial hemorrhage, mass lesion mass-effect. Orbits are unremarkable. Soft tissues are unremarkable. Mucosal thickening within the ethmoid air cells. Air-fluid level within the sphenoid sinus. Mastoid air cells are unremarkable. IMPRESSION: No acute intracranial process. Stable prominence of lateral ventricles bilaterally. Findings suggestive of paranasal sinusitis. Electronically Signed   By: Annia Belt M.D.   On: 06/11/2015 21:16   Mr Brain Wo Contrast  06/12/2015  CLINICAL DATA:  Confusion, difficulty breathing and abnormal movement after a eating. Possible seizure. History of dementia, hypertension. EXAM: MRI HEAD WITHOUT CONTRAST TECHNIQUE: Sagittal T1, axial and coronal diffusion weighted imaging, axial T2 and T2 FLAIR on a 1.5 tesla scanner. COMPARISON:  CT head June 11, 2015 and MRI  of the brain July 26, 2013 FINDINGS: Patient was unable to remain still throughout the examination, incomplete study. Obtained sequences are motion degraded. No reduced diffusion to suggest acute ischemia. Moderate to severe ventriculomegaly, predominately involving the lateral ventricles with associated white matter/ parenchymal brain volume loss. No midline shift or mass effect. At least mild chronic small vessel ischemic disease, limited by motion. No abnormal extra-axial fluid collections. Major intracranial vascular flow voids present at the skullbase. Mild paranasal sinusitis. Imaged mastoid is are aircells are well-aerated. No abnormal sellar expansion. No cerebellar tonsillar ectopia. No suspicious  calvarial bone marrow signal. IMPRESSION: Incomplete motion degraded examination. No acute intracranial process. Advanced brain atrophy for age, stable from prior imaging. At least mild chronic small vessel ischemic disease. Electronically Signed   By: Awilda Metroourtnay  Bloomer M.D.   On: 06/12/2015 01:51   Dg Chest Portable 1 View  06/11/2015  CLINICAL DATA:  Confusion EXAM: PORTABLE CHEST 1 VIEW COMPARISON:  02/20/2015 chest radiograph. FINDINGS: Slightly low lung volumes. Stable cardiomediastinal silhouette with top-normal heart size. No pneumothorax. No pleural effusion. Vascular crowding without overt pulmonary edema. No focal lung consolidation. IMPRESSION: Low lung volumes with no active cardiopulmonary disease. Electronically Signed   By: Delbert PhenixJason A Poff M.D.   On: 06/11/2015 20:26    Jeoffrey MassedGHIMIRE,Alaira Level, MD  [facility] Pager:[phone]  If 7PM-7AM, please contact night-coverage [email or web address] Password [email or web address] 06/12/2015, 11:32 AM   LOS: 0 days

## 2015-06-12 NOTE — Procedures (Signed)
EEG report.  Brief clinical history:  69 year old male with a diagnosis of mild Alzheimer's dementia who has been having progressive dysphagia, dysarthria progressing to anarthria for the past 6-12 months.  Technique: this is a 17 channel routine scalp EEG performed at the bedside with bipolar and monopolar montages arranged in accordance to the international 10/20 system of electrode placement. One channel was dedicated to EKG recording.  The patient is not following commands and is asleep during a significant portion of the study. No activating procedures performed.  Description: as the study opens and throughout the majority of the recording, there is diffuse, generalized 5-6 Hz activity that does not follow an ictal pattern at any time. Additionally, towards the final minutes of the study there appears to be intermixed normal sleep architecture.  No focal or generalized epileptiform discharges noted.  EKG showed sinus rhythm.  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 No electrographic seizures noted.  Clinical correlation is advised.   Wyatt Portelasvaldo Jahmez Bily, MD [facility]

## 2015-06-12 NOTE — Progress Notes (Signed)
Subjective: Patient continues to be slightly agitated with nonpurposeful its of arms and legs although markedly improved since receiving Ativan this afternoon. He has attempted to speak but speech is unintelligible. Family met with Palliative Medicine. DO NOT RESUSCITATE/DO NOT INTUBATE status was elected. Possible hospice admission was also discussed.  Objective: Current vital signs: BP 135/87 mmHg  Pulse 70  Temp(Src) 98.2 F (36.8 C) (Oral)  Resp 22  Ht '5\' 8"'$  (1.727 m)  Wt 85.322 kg (188 lb 1.6 oz)  BMI 28.61 kg/m2  SpO2 97%  Neurologic Exam: Patient was somnolent for the most part but had intermittent brief periods of arousal during which he exhibited oiling movements of arms and legs. He appears to recognize family members. Strength was normal and symmetrical throughout.  EEG showed nonspecific generalized continuous slowing of cerebral activity. No epileptiform activity was reported.  MRI of his brain showed diffuse atrophy which was considered advanced for his age. No acute findings were reported.  Medications: I have reviewed the patient's current medications.  Assessment/Plan: 69 year old man with progressive CNS degenerative disorder with dementia as well as supportive bulbar dysfunction with severely impaired speech and swallowing. 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  Recommend continuing with mild sedating medication as needed. He responded well to 1 mg of Ativan earlier today.  We will continue to follow this patient closely with you area  C.R. Nicole Kindred, MD [facility] [phone]  06/12/2015  6:21 PM

## 2015-06-12 NOTE — Progress Notes (Signed)
Pt arrived to 5C10 from [facility]. Pt transferred from stretcher to bed with 3 assist. Pt alert but mute with sporadic spastic movement of all four extremities.  Pt on 2L Alpha with oxygen sat at 100%. Telemetry box applied. Vitals stable.

## 2015-06-12 NOTE — Progress Notes (Signed)
EEG completed; results pending.    

## 2015-06-13 LAB — BASIC METABOLIC PANEL
ANION GAP: 5 (ref 5–15)
BUN: 9 mg/dL (ref 6–20)
CHLORIDE: 107 mmol/L (ref 101–111)
CO2: 29 mmol/L (ref 22–32)
Calcium: 8.9 mg/dL (ref 8.9–10.3)
Creatinine, Ser: 0.98 mg/dL (ref 0.61–1.24)
GFR calc Af Amer: >60 mL/min (ref >60)
GFR calc non Af Amer: >60 mL/min (ref >60)
GLUCOSE: 84 mg/dL (ref 65–99)
POTASSIUM: 4.2 mmol/L (ref 3.5–5.1)
Sodium: 141 mmol/L (ref 135–145)

## 2015-06-13 MED ORDER — LORAZEPAM 2 MG/ML IJ SOLN: 1.0000 mg | INTRAMUSCULAR | Status: AC | PRN

## 2015-06-13 MED ORDER — LORAZEPAM 2 MG/ML IJ SOLN
1.0000 mg | INTRAMUSCULAR | Status: DC | PRN
  Administered 2015-06-13 (×3): 2 mg via INTRAVENOUS
  Filled 2015-06-13 (×2): qty 1

## 2015-06-13 MED ORDER — MORPHINE SULFATE (CONCENTRATE) 10 MG /0.5 ML PO SOLN: 5.0000 mg | ORAL | Status: AC | PRN

## 2015-06-13 MED ORDER — MORPHINE SULFATE 2 MG/ML IJ SOLN: 2.0000 mg | INTRAMUSCULAR | Status: AC | PRN

## 2015-06-13 NOTE — Progress Notes (Signed)
Subjective: Patient continues to be intermittently agitated with nonpurposeful oiling of limbs, lower extremities more so than upper extremities. Symptoms are much less intense as well as less frequent than starting Ativan.  Objective: Current vital signs: BP 139/85 mmHg  Pulse 72  Temp(Src) 97.7 F (36.5 C) (Axillary)  Resp 18  Ht 5\' 8"  (1.727 m)  Wt 85.322 kg (188 lb 1.6 oz)  BMI 28.61 kg/m2  SpO2 98%  Neurologic Exam: Patient exhibited fairly loud snoring along with flailing extremities intermittently. He otherwise appeared to be resting quietly. He was responsive to verbal and tactile stimulation with visual fixation and garbled speech output. Extraocular movements were full with right left lateral gaze, and were conjugate. Face was symmetrical. Strength of extremities was normal and symmetrical throughout.  Medications: I have reviewed the patient's current medications.  Assessment/Plan: 69 year old man with progressive CNS degenerative disorder with dementia as well as supportive bulbar dysfunction with severely impaired speech and swallowing. 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 Intensity of agitation has managed starting Ativan, which appears to be tolerated well.  Recommend no changes in management at this point. Palliative Medicine is working with the family regarding advanced directives and end-of-life care decisions. No urgent neurodiagnostic studies are indicated at this point.  We will continue to follow this patient with you.  C.R. Roseanne RenoStewart, MD [facility] [phone]  06/13/2015  10:18 AM

## 2015-06-13 NOTE — Progress Notes (Signed)
   06/13/15 1400  Clinical Encounter Type  Visited With Patient and family together;Health care provider  Visit Type Initial  Referral From Nurse  Stress Factors  Family Stress Factors None identified   Chaplain checked in on patient and patient's family, and introduced spiritual care services. Family indicated they are fine at this time. Chaplain support available as needed/desired.   Alda PonderAdam M Candy Ziegler, Chaplain 06/13/2015 2:45 PM

## 2015-06-13 NOTE — Clinical Social Work Note (Addendum)
CSW received consult for patient needing residential hospice placement, patient's family would like [facility] in [place] due to patient and family living in the area.  CSW contacted Olegario MessierKathy at [facility], who requested patient's clinicals to be faxed over to facility so they can review patient's information to see if they can take the patient.  Hospice House will contact CSW with decision.  CSW to continue to follow patient's progress, gold DNR form on patient's chart awaiting signature by physician.  4:30pm  CSW received phone call back from [facility] who said they can take patient tonight.  Discharge summary has been completed and DNR has been signed.  Patient to be d/c'ed today to [facility], patient and family agreeable to plans will transport via ems RN to call report to [phone]  Ervin Knackric R. Hassan Rowannterhaus, MSW, Theresia MajorsLCSWA [phone] 06/13/2015 4:53 PM    Ervin KnackEric R. Bannie Lobban, MSW, Theresia MajorsLCSWA [phone] 06/13/2015 3:10 PM

## 2015-06-13 NOTE — Progress Notes (Signed)
Nutrition Brief Note  Patient identified due to low Braden score.   Wt Readings from Last 15 Encounters:  06/12/15 188 lb 1.6 oz (85.322 kg)  01/24/15 224 lb (101.606 kg)  05/28/14 215 lb (97.523 kg)  04/03/13 220 lb (99.791 kg)  03/20/13 213 lb (96.616 kg)  02/16/13 216 lb (97.977 kg)  01/12/12 208 lb (94.348 kg)  12/31/11 208 lb 12.8 oz (94.711 kg)    Body mass index is 28.61 kg/(m^2). Patient meets criteria for Overweight based on current BMI.   Current diet order is NPO. Pt is being followed by palliative care; plan is for comfort feeds if possible and residential hospice placement. Pt not alert enough to take PO's at this time. Labs and medications reviewed.   No nutrition interventions warranted at this time. If nutrition issues arise, please consult RD.   Dorothea Ogleeanne Linde Wilensky RD, LDN Inpatient Clinical Dietitian Pager: [phone] After Hours Pager: [phone]

## 2015-06-13 NOTE — Progress Notes (Signed)
Before seeing order for SLP dc, SLP attempted to see pt.  Pt continues with lethargy and appears apenic.  He did not awaken adequately to attempt po intake.  Please reorder if pt becomes alert and appropriate. Don Burnetamara Mickle Campton, MS [facility] SLP [phone]

## 2015-06-13 NOTE — Discharge Summary (Addendum)
PATIENT DETAILS Name: Don Garcia Age: 69 y.o. Sex: male Date of Birth: [birth year] MRN: [id number]. Admitting Physician: Hillary Bow, DO WUJ:WJXBJYN,WGNFA L, MD  Admit Date: 06/11/2015 Discharge date: 06/13/2015  Recommendations for Outpatient Follow-up:  1. Optimize comfort medications.   PRIMARY DISCHARGE DIAGNOSIS:  Principal Problem:   Acute encephalopathy Active Problems:   Bulbar motor neuron disease (HCC)   Dysphagia   Alzheimer's dementia   Encephalopathy acute   Acute confusional state   Encounter for palliative care   Goals of care, counseling/discussion      PAST MEDICAL HISTORY: Past Medical History  Diagnosis Date  . Hypertension   . COPD (chronic obstructive pulmonary disease) (HCC)   . Hyperlipidemia   . Depression   . Anxiety   . GERD (gastroesophageal reflux disease)   . Barrett esophagus   . Diverticulosis     DISCHARGE MEDICATIONS: Current Discharge Medication List    START taking these medications   Details  LORazepam (ATIVAN) 2 MG/ML injection Inject 0.5-1 mLs (1-2 mg total) into the vein every 2 (two) hours as needed for seizure (as needed for abnormal upper extremity or lower extremity movements.). Qty: 5 mL, Refills: 0    morphine 2 MG/ML injection Inject 1 mL (2 mg total) into the vein every 4 (four) hours as needed (sedation/comfort). Qty: 5 mL, Refills: 0    Morphine Sulfate (MORPHINE CONCENTRATE) 10 mg / 0.5 ml concentrated solution Take 0.25 mLs (5 mg total) by mouth every 4 (four) hours as needed for moderate pain or shortness of breath. Qty: 30 mL, Refills: 0      CONTINUE these medications which have NOT CHANGED   Details  divalproex (DEPAKOTE SPRINKLE) 125 MG capsule Take 125 mg by mouth 2 (two) times daily.    OLANZapine (ZYPREXA) 2.5 MG tablet Take 2.5 mg by mouth at bedtime.      STOP taking these medications     ALPRAZolam (XANAX) 0.5 MG tablet      amLODipine (NORVASC) 5 MG tablet      aspirin 81  MG tablet      benztropine (COGENTIN) 1 MG tablet      buPROPion (WELLBUTRIN) 100 MG tablet      desmopressin (DDAVP) 0.2 MG tablet      donepezil (ARICEPT) 10 MG tablet      fluticasone (FLONASE) 50 MCG/ACT nasal spray      lovastatin (MEVACOR) 20 MG tablet      meloxicam (MOBIC) 15 MG tablet      metoprolol succinate (TOPROL-XL) 50 MG 24 hr tablet      Multiple Vitamin (MULTIVITAMIN) capsule      omeprazole (PRILOSEC) 40 MG capsule      pramipexole (MIRAPEX) 0.5 MG tablet      ranitidine (ZANTAC) 300 MG tablet      rOPINIRole (REQUIP) 1 MG tablet      sertraline (ZOLOFT) 100 MG tablet      temazepam (RESTORIL) 15 MG capsule      montelukast (SINGULAIR) 10 MG tablet         ALLERGIES:   Allergies  Allergen Reactions  . Celebrex [Celecoxib]     lethargy  . Vioxx [Rofecoxib]     lethargy    BRIEF HPI:  See H&P, Labs, Consult and Test reports for all details in brief, patient  is a 69 y.o. male with a history of Alzheimer's type dementia (Diagnosed clinically by Dr. Adella Hare in [place]) Who presented with abnormal movements and increased confusion  CONSULTATIONS:   neurology and Palliative care  PERTINENT RADIOLOGIC STUDIES: Ct Head Wo Contrast  06/11/2015  CLINICAL DATA:  Patient with confusion. History of Parkinson's. Seizure like activity. EXAM: CT HEAD WITHOUT CONTRAST TECHNIQUE: Contiguous axial images were obtained from the base of the skull through the vertex without intravenous contrast. COMPARISON:  Brain CT 02/17/2015 FINDINGS: Lateral ventricles are prominent, stable from prior. No evidence for acute cortically based infarct, intracranial hemorrhage, mass lesion mass-effect. Orbits are unremarkable. Soft tissues are unremarkable. Mucosal thickening within the ethmoid air cells. Air-fluid level within the sphenoid sinus. Mastoid air cells are unremarkable. IMPRESSION: No acute intracranial process. Stable prominence of lateral ventricles bilaterally.  Findings suggestive of paranasal sinusitis. Electronically Signed   By: Annia Beltrew  Davis M.D.   On: 06/11/2015 21:16   Mr Brain Wo Contrast  06/12/2015  CLINICAL DATA:  Confusion, difficulty breathing and abnormal movement after a eating. Possible seizure. History of dementia, hypertension. EXAM: MRI HEAD WITHOUT CONTRAST TECHNIQUE: Sagittal T1, axial and coronal diffusion weighted imaging, axial T2 and T2 FLAIR on a 1.5 tesla scanner. COMPARISON:  CT head June 11, 2015 and MRI of the brain July 26, 2013 FINDINGS: Patient was unable to remain still throughout the examination, incomplete study. Obtained sequences are motion degraded. No reduced diffusion to suggest acute ischemia. Moderate to severe ventriculomegaly, predominately involving the lateral ventricles with associated white matter/ parenchymal brain volume loss. No midline shift or mass effect. At least mild chronic small vessel ischemic disease, limited by motion. No abnormal extra-axial fluid collections. Major intracranial vascular flow voids present at the skullbase. Mild paranasal sinusitis. Imaged mastoid is are aircells are well-aerated. No abnormal sellar expansion. No cerebellar tonsillar ectopia. No suspicious calvarial bone marrow signal. IMPRESSION: Incomplete motion degraded examination. No acute intracranial process. Advanced brain atrophy for age, stable from prior imaging. At least mild chronic small vessel ischemic disease. Electronically Signed   By: Awilda Metroourtnay  Bloomer M.D.   On: 06/12/2015 01:51   Dg Chest Portable 1 View  06/11/2015  CLINICAL DATA:  Confusion EXAM: PORTABLE CHEST 1 VIEW COMPARISON:  02/20/2015 chest radiograph. FINDINGS: Slightly low lung volumes. Stable cardiomediastinal silhouette with top-normal heart size. No pneumothorax. No pleural effusion. Vascular crowding without overt pulmonary edema. No focal lung consolidation. IMPRESSION: Low lung volumes with no active cardiopulmonary disease. Electronically  Signed   By: Delbert PhenixJason A Poff M.D.   On: 06/11/2015 20:26     PERTINENT LAB RESULTS: CBC:  Recent Labs  06/11/15 1959  WBC 6.8  HGB 14.1  HCT 41.4  PLT 163   CMET CMP     Component Value Date/Time   NA 141 06/13/2015 0620   K 4.2 06/13/2015 0620   CL 107 06/13/2015 0620   CO2 29 06/13/2015 0620   GLUCOSE 84 06/13/2015 0620   BUN 9 06/13/2015 0620   CREATININE 0.98 06/13/2015 0620   CALCIUM 8.9 06/13/2015 0620   PROT 5.9* 06/11/2015 1959   ALBUMIN 3.4* 06/11/2015 1959   AST 25 06/11/2015 1959   ALT 43 06/11/2015 1959   ALKPHOS 100 06/11/2015 1959   BILITOT 0.4 06/11/2015 1959   GFRNONAA >60 06/13/2015 0620   GFRAA >60 06/13/2015 0620    GFR Estimated Creatinine Clearance: 77.8 mL/min (by C-G formula based on Cr of 0.98). No results for input(s): LIPASE, AMYLASE in the last 72 hours.  Recent Labs  06/11/15 1959  CKTOTAL 95   Invalid input(s): POCBNP No results for input(s): DDIMER in the last 72 hours. No results for input(s):  HGBA1C in the last 72 hours. No results for input(s): CHOL, HDL, LDLCALC, TRIG, CHOLHDL, LDLDIRECT in the last 72 hours. No results for input(s): TSH, T4TOTAL, T3FREE, THYROIDAB in the last 72 hours.  Invalid input(s): FREET3 No results for input(s): VITAMINB12, FOLATE, FERRITIN, TIBC, IRON, RETICCTPCT in the last 72 hours. Coags: No results for input(s): INR in the last 72 hours.  Invalid input(s): PT Microbiology: No results found for this or any previous visit (from the past 240 hour(s)).   BRIEF HOSPITAL COURSE:  Progressive bulbar dysfunction with worsening dysphagia, dysarthria: MRI brain negative for acute abnormalities, EEG negative for seizures. Appears to have severe bulbar dysfunction-per neurology motor neuron disease is in the differentials. Seen by speech therapy today-recommendations are to keep nothing by mouth. After discussion with neurology who agreed with poor overall prognosis, palliative care consultation was  obtained. Palliative care spoke with family, subsequently transitioned to full comfort measures, DO NOT RESUSCITATE in place. Plans are for residential hospice for comfort care/symptom management of pain and dyspnea. Family does not desire PEG tube placement.   Active Problems: ?Stridor: Seen by ENT-no further recommendations at this point-underwent laryngoscopy without major findings.  Hypertension: Blood pressure currently controlled-without the use of antihypertensives.no plans to resume antihypertensives-as for comfort measures.   Depression: transitioned to full comfort measures-we will see if patient is able to take olanzapine and Depakote.   GERD:PPI  TODAY-DAY OF DISCHARGE:  Subjective:   Kayman Snuffer remains encephalopathic-continues to have involuntary movements  Objective:   Blood pressure 147/87, pulse 74, temperature 98 F (36.7 C), temperature source Axillary, resp. rate 23, height 5\' 8"  (1.727 m), weight 85.322 kg (188 lb 1.6 oz), SpO2 100 %. No intake or output data in the 24 hours ending 06/13/15 1611 Filed Weights   06/12/15 0215  Weight: 85.322 kg (188 lb 1.6 oz)    Exam Gen Exam:Encephalopathic-continues to have involuntary muscle movements  Neck: Supple, No JVD.  Chest: B/L Clear anteriorly  CVS: S1 S2 Regular, no murmurs.  Abdomen: soft, BS +, non tender, non distended.  Extremities: no edema, lower extremities warm to touch. Neurologic: Moves all 4 extremities-difficult exam-appears to have spontaneous myoclonic jerks.  DISCHARGE CONDITION: Stable  DISPOSITION: Residential Hospice  DISCHARGE INSTRUCTIONS:    Activity:  As tolerated   Get Medicines reviewed and adjusted: Please take all your medications with you for your next visit with your Primary MD  Please request your Primary MD to go over all hospital tests and procedure/radiological results at the follow up, please ask your Primary MD to get all Hospital records sent to his/her  office.  If you experience worsening of your admission symptoms, develop shortness of breath, life threatening emergency, suicidal or homicidal thoughts you must seek medical attention immediately by calling 911 or calling your MD immediately  if symptoms less severe.  You must read complete instructions/literature along with all the possible adverse reactions/side effects for all the Medicines you take and that have been prescribed to you. Take any new Medicines after you have completely understood and accpet all the possible adverse reactions/side effects.   Do not drive when taking Pain medications.   Do not take more than prescribed Pain, Sleep and Anxiety Medications  Special Instructions: If you have smoked or chewed Tobacco  in the last 2 yrs please stop smoking, stop any regular Alcohol  and or any Recreational drug use.  Wear Seat belts while driving.  Please note  You were cared for by a hospitalist during  your hospital stay. Once you are discharged, your primary care physician will handle any further medical issues. Please note that NO REFILLS for any discharge medications will be authorized once you are discharged, as it is imperative that you return to your primary care physician (or establish a relationship with a primary care physician if you do not have one) for your aftercare needs so that they can reassess your need for medications and monitor your lab values.   Diet recommendation: NPO-ok for comfort feeds if desired by family   Follow-up Information    Follow up with Ailene Ravel, MD.   Specialty:  Family Medicine   Why:  As needed   Contact information:   [address] [place] [address] [phone]       Total Time spent on discharge equals 25  Minutes.  SignedJeoffrey Massed 06/13/2015 4:11 PM

## 2015-06-13 NOTE — Progress Notes (Signed)
Daily Progress Note   Patient Name: Don Garcia       Date: 06/13/2015 DOB: 05/28/1946  Age: 69 y.o. MRN#: [id number] Attending Physician: Maretta Bees, MD Primary Care Physician: Ailene Ravel, MD Admit Date: 06/11/2015  Reason for Consultation/Follow-up: Non pain symptom management, goals of care, disposition planning.   Subjective: Remains restless appears in mild to moderate distress.   Interval Events: Neuro note reviewed Discussed with Dr Roseanne Reno and Dr Jerral Ralph Call placed and discussed with patient's wife. She states that PEG tube placement would not be in line with patient's previously expressed wishes.  We had initiated hospice discussions on 06-12-15:  PLAN: CSW consult for residential hospice placement Comfort feeds Ativan IV PRN for symptom management.  Try to give the Zyprexa and Depakote with applesauce/pudding consistency comfort feeds if possible. If not, then D/C Have D/C most of the patient's PO medications, patient is NPO.  Ok to continue IVF until D/C to hospice  Length of Stay: 1 day  Current Medications: Scheduled Meds:  . OLANZapine  2.5 mg Per Tube QHS  . valproic acid  125 mg Per Tube BID    Continuous Infusions: . sodium chloride 75 mL/hr at 06/13/15 0541    PRN Meds: LORazepam  Physical Exam: Physical Exam             Mild to moderate distress Coarse rhonchi anteriorly S1 S2 No edema  No coolness no mottling Awake,mumbles incoherently Does not follow commands  Vital Signs: BP 139/85 mmHg  Pulse 72  Temp(Src) 97.7 F (36.5 C) (Axillary)  Resp 18  Ht  (1.727 m)  Wt 85.322 kg (188 lb 1.6 oz)  BMI 28.61 kg/m2  SpO2 98% SpO2: SpO2: 98 % O2 Device: O2 Device: Nasal Cannula O2 Flow Rate: O2 Flow Rate (L/min): 3  L/min  Intake/output summary: No intake or output data in the 24 hours ending 06/13/15 0945 LBM: Last BM Date: 06/12/15 Baseline Weight: Weight: 85.322 kg (188 lb 1.6 oz) Most recent weight: Weight: 85.322 kg (188 lb 1.6 oz)       Palliative Assessment/Data: Flowsheet Rows        Most Recent Value   Intake Tab    Referral Department  Hospitalist   Unit at Time of Referral  Med/Surg Unit   Palliative Care Primary  Diagnosis  Neurology   Palliative Care Type  Return patient Palliative Care   Reason for referral  Non-pain Symptom, Counsel Regarding Hospice, End of Life Care Assistance   Date first seen by Palliative Care  06/12/15   Clinical Assessment    Palliative Performance Scale Score  10%   Pain Max last 24 hours  5   Pain Min Last 24 hours  4   Dyspnea Max Last 24 Hours  2   Dyspnea Min Last 24 hours  1   Nausea Min Last 24 Hours  3   Anxiety Max Last 24 Hours  5   Anxiety Min Last 24 Hours  4   Psychosocial & Spiritual Assessment    Social Work Plan of Care  Education on Hospice   Palliative Care Outcomes    Patient/Family meeting held?  Yes   Who was at the meeting?  over the phone with wife   Palliative Care Outcomes  Improved non-pain symptom therapy, Clarified goals of care, Counseled regarding hospice   Patient/Family wishes: Interventions discontinued/not started   PEG   Palliative Care follow-up planned  Yes, Facility      Additional Data Reviewed: CBC    Component Value Date/Time   WBC 6.8 06/11/2015 1959   RBC 4.67 06/11/2015 1959   HGB 14.1 06/11/2015 1959   HCT 41.4 06/11/2015 1959   PLT 163 06/11/2015 1959   MCV 88.7 06/11/2015 1959   MCH 30.2 06/11/2015 1959   MCHC 34.1 06/11/2015 1959   RDW 13.8 06/11/2015 1959   LYMPHSABS 1.6 06/11/2015 1959   MONOABS 0.6 06/11/2015 1959   EOSABS 0.2 06/11/2015 1959   BASOSABS 0.0 06/11/2015 1959    CMP     Component Value Date/Time   NA 141 06/13/2015 0620   K 4.2 06/13/2015 0620   CL 107 06/13/2015  0620   CO2 29 06/13/2015 0620   GLUCOSE 84 06/13/2015 0620   BUN 9 06/13/2015 0620   CREATININE 0.98 06/13/2015 0620   CALCIUM 8.9 06/13/2015 0620   PROT 5.9* 06/11/2015 1959   ALBUMIN 3.4* 06/11/2015 1959   AST 25 06/11/2015 1959   ALT 43 06/11/2015 1959   ALKPHOS 100 06/11/2015 1959   BILITOT 0.4 06/11/2015 1959   GFRNONAA >60 06/13/2015 0620   GFRAA >60 06/13/2015 0620       Problem List:  Patient Active Problem List   Diagnosis Date Noted  . Acute encephalopathy 06/12/2015  . 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 Dysphagia 06/12/2015  . Alzheimer's dementia 06/12/2015  . Encephalopathy acute   . Acute confusional state   . Encounter for palliative care   . Goals of care, counseling/discussion   . Asthma 03/05/2015  . GERD (gastroesophageal reflux disease) 03/05/2015  . Allergic rhinitis 03/05/2015  . Non-allergic rhinitis 03/05/2015  . SOB (shortness of breath) 02/16/2013  . Tachycardia 02/16/2013     Palliative Care Assessment & Plan    1.Code Status:  DNR    Code Status Orders        Start     Ordered   06/12/15 1612  Do not attempt resuscitation (DNR)   Continuous    Question Answer Comment  In the event of cardiac or respiratory ARREST Do not call a "code blue"   In the event of cardiac or respiratory ARREST Do not perform Intubation, CPR, defibrillation or ACLS   In the event of cardiac or respiratory ARREST Use medication by any route, position, wound care, and other  measures to relive pain and suffering. May use oxygen, suction and manual treatment of airway obstruction as needed for comfort.      06/12/15 1612       2. Goals of Care/Additional Recommendations:   hospice home when arrangements completed, comfort feeds. Have D/C all PO meds  Limitations on Scope of Treatment: Full Comfort Care  Desire for further Chaplaincy support:no  Psycho-social Needs: Education on Hospice  3. Symptom Management:      1. As above   4.  Palliative Prophylaxis:   Delirium Protocol  5. Prognosis: < 2 weeks  6. Discharge Planning:  Hospice facility   Care plan was discussed with wife, Dr StewartDr Jerral RalphGhimire  Thank you for allowing the Palliative Medicine Team to assist in the care of this patient.   Time In: 0900 Time Out: 0935 Total Time 35 min Prolonged Time Billed  no       [phone]  Rosalin HawkingZeba Ragina Fenter, MD  06/13/2015, 9:45 AM  Please contact Palliative Medicine Team phone at [phone] for questions and concerns.

## 2015-06-13 NOTE — Progress Notes (Signed)
PATIENT DETAILS Name: Docia BarrierMichael L Cavallaro Age: 69 y.o. Sex: male Date of Birth: [birth year] Admit Date: 06/11/2015 Admitting Physician Hillary BowJared M Gardner, DO BMW:UXLKGMW,NUUVOPCP:HAMRICK,MAURA L, MD  Subjective: Awake-follows commands-appears to have severe dysarthria and dysphagia.  Assessment/Plan: Principal Problem: Progressive bulbar dysfunction with worsening dysphagia, dysarthria: MRI brain negative for acute abnormalities, EEG negative for seizures. Appears to have severe bulbar dysfunction-per neurology motor neuron disease is in the differentials. Seen by speech therapy today-recommendations are to keep nothing by mouth. After discussion with neurology who agreed with poor overall prognosis, palliative care consultation was obtained. Palliative care spoke with family, subsequently transitioned to full comfort measures, DO NOT RESUSCITATE in place. Plans are for residential hospice. Family does not desire PEG tube placement.   Active Problems: ?Stridor: Seen by ENT-no further recommendations at this point-underwent laryngoscopy without major findings.  Hypertension: Blood pressure currently controlled-without the use of antihypertensives.no plans to resume antihypertensives-as for comfort measures.   Depression: transitioned to full comfort measures-we will see if patient is able to take olanzapine and Depakote.    GERD:PPI  Disposition: Remain inpatient-residential hospice on discharge  Antimicrobial agents  See below  Anti-infectives    None      DVT Prophylaxis: Not needed as comfort measures  Code Status: DNR  Family Communication None today Spoke with sisters, spouse and son on 12/29 at bedside   Procedures: nONE  CONSULTS:  neurology  Palliative care  Time spent 20 minutes-Greater than 50% of this time was spent in counseling, explanation of diagnosis, planning of further management, and coordination of care.  MEDICATIONS: Scheduled Meds: .  OLANZapine  2.5 mg Per Tube QHS  . valproic acid  125 mg Per Tube BID   Continuous Infusions: . sodium chloride 75 mL/hr at 06/13/15 0541   PRN Meds:.LORazepam    PHYSICAL EXAM: Vital signs in last 24 hours: Filed Vitals:   06/12/15 2139 06/13/15 0206 06/13/15 0539 06/13/15 1039  BP: 119/82 156/60 139/85 132/82  Pulse: 66 70 72 89  Temp: 98.2 F (36.8 C) 97.6 F (36.4 C) 97.7 F (36.5 C) 98.9 F (37.2 C)  TempSrc: Oral Oral Axillary Axillary  Resp: 20 20 18 22   Height:      Weight:      SpO2: 99% 100% 98% 92%    Weight change:  Filed Weights   06/12/15 0215  Weight: 85.322 kg (188 lb 1.6 oz)   Body mass index is 28.61 kg/(m^2).   Gen Exam: Awake, follows commands-squeezes my fingers-moves extremities on commands.   continues to have involuntary muscle movements  Neck: Supple, No JVD.   Chest: B/L Clear anteriorly   CVS: S1 S2 Regular, no murmurs.  Abdomen: soft, BS +, non tender, non distended.  Extremities: no edema, lower extremities warm to touch. Neurologic: Moves all 4 extremities-difficult exam-appears to have spontaneous myoclonic jerks. Hyperreflexia. Skin: No Rash.   Wounds: N/A.    Intake/Output from previous day: No intake or output data in the 24 hours ending 06/13/15 1320   LAB RESULTS: CBC  Recent Labs Lab 06/11/15 1959  WBC 6.8  HGB 14.1  HCT 41.4  PLT 163  MCV 88.7  MCH 30.2  MCHC 34.1  RDW 13.8  LYMPHSABS 1.6  MONOABS 0.6  EOSABS 0.2  BASOSABS 0.0    Chemistries   Recent Labs Lab 06/11/15 1959 06/13/15 0620  NA 141 141  K 4.1 4.2  CL 107 107  CO2 27 29  GLUCOSE 97 84  BUN 20 9  CREATININE 1.04 0.98  CALCIUM 9.0 8.9    CBG:  Recent Labs Lab 06/11/15 1951  GLUCAP 88    GFR Estimated Creatinine Clearance: 77.8 mL/min (by C-G formula based on Cr of 0.98).  Coagulation profile No results for input(s): INR, PROTIME in the last 168 hours.  Cardiac Enzymes No results for input(s): CKMB, TROPONINI, MYOGLOBIN  in the last 168 hours.  Invalid input(s): CK  Invalid input(s): POCBNP No results for input(s): DDIMER in the last 72 hours. No results for input(s): HGBA1C in the last 72 hours. No results for input(s): CHOL, HDL, LDLCALC, TRIG, CHOLHDL, LDLDIRECT in the last 72 hours. No results for input(s): TSH, T4TOTAL, T3FREE, THYROIDAB in the last 72 hours.  Invalid input(s): FREET3 No results for input(s): VITAMINB12, FOLATE, FERRITIN, TIBC, IRON, RETICCTPCT in the last 72 hours. No results for input(s): LIPASE, AMYLASE in the last 72 hours.  Urine Studies No results for input(s): UHGB, CRYS in the last 72 hours.  Invalid input(s): UACOL, UAPR, USPG, UPH, UTP, UGL, UKET, UBIL, UNIT, UROB, ULEU, UEPI, UWBC, URBC, UBAC, CAST, UCOM, BILUA  MICROBIOLOGY: No results found for this or any previous visit (from the past 240 hour(s)).  RADIOLOGY STUDIES/RESULTS: Ct Head Wo Contrast  06/11/2015  CLINICAL DATA:  Patient with confusion. History of Parkinson's. Seizure like activity. EXAM: CT HEAD WITHOUT CONTRAST TECHNIQUE: Contiguous axial images were obtained from the base of the skull through the vertex without intravenous contrast. COMPARISON:  Brain CT 02/17/2015 FINDINGS: Lateral ventricles are prominent, stable from prior. No evidence for acute cortically based infarct, intracranial hemorrhage, mass lesion mass-effect. Orbits are unremarkable. Soft tissues are unremarkable. Mucosal thickening within the ethmoid air cells. Air-fluid level within the sphenoid sinus. Mastoid air cells are unremarkable. IMPRESSION: No acute intracranial process. Stable prominence of lateral ventricles bilaterally. Findings suggestive of paranasal sinusitis. Electronically Signed   By: Annia Belt M.D.   On: 06/11/2015 21:16   Mr Brain Wo Contrast  06/12/2015  CLINICAL DATA:  Confusion, difficulty breathing and abnormal movement after a eating. Possible seizure. History of dementia, hypertension. EXAM: MRI HEAD WITHOUT  CONTRAST TECHNIQUE: Sagittal T1, axial and coronal diffusion weighted imaging, axial T2 and T2 FLAIR on a 1.5 tesla scanner. COMPARISON:  CT head June 11, 2015 and MRI of the brain July 26, 2013 FINDINGS: Patient was unable to remain still throughout the examination, incomplete study. Obtained sequences are motion degraded. No reduced diffusion to suggest acute ischemia. Moderate to severe ventriculomegaly, predominately involving the lateral ventricles with associated white matter/ parenchymal brain volume loss. No midline shift or mass effect. At least mild chronic small vessel ischemic disease, limited by motion. No abnormal extra-axial fluid collections. Major intracranial vascular flow voids present at the skullbase. Mild paranasal sinusitis. Imaged mastoid is are aircells are well-aerated. No abnormal sellar expansion. No cerebellar tonsillar ectopia. No suspicious calvarial bone marrow signal. IMPRESSION: Incomplete motion degraded examination. No acute intracranial process. Advanced brain atrophy for age, stable from prior imaging. At least mild chronic small vessel ischemic disease. Electronically Signed   By: Awilda Metro M.D.   On: 06/12/2015 01:51   Dg Chest Portable 1 View  06/11/2015  CLINICAL DATA:  Confusion EXAM: PORTABLE CHEST 1 VIEW COMPARISON:  02/20/2015 chest radiograph. FINDINGS: Slightly low lung volumes. Stable cardiomediastinal silhouette with top-normal heart size. No pneumothorax. No pleural effusion. Vascular crowding without overt pulmonary edema. No focal lung consolidation. IMPRESSION: Low lung volumes with  no active cardiopulmonary disease. Electronically Signed   By: Delbert Phenix M.D.   On: 06/11/2015 20:26    Jeoffrey Massed, MD  [facility] Pager:[phone]  If 7PM-7AM, please contact night-coverage [email or web address] Password [email or web address] 06/13/2015, 1:20 PM   LOS: 1 day

## 2015-06-14 NOTE — Progress Notes (Signed)
Pt [facility] to hospice per family wishes[facility] sent [facility]. Patient transported via stretcher by [facility] at 1714. Lawson RadarHeather M Bellarose Burtt

## 2015-07-16 DEATH — deceased

## 2016-10-17 IMAGING — RF DG ESOPHAGUS
6 series · 24 of 24 positions shown · non-contrast
Comparison: 04/05/2013

CLINICAL DATA: Six months of dysphagia with solids and liquids,
progressive.

EXAM:
ESOPHOGRAM / BARIUM SWALLOW / BARIUM TABLET STUDY
TECHNIQUE: Combined double contrast and single contrast examination performed
using effervescent crystals, thick barium liquid, and thin barium
liquid. The patient was observed with fluoroscopy swallowing a 13 mm
barium sulphate tablet.
FLUOROSCOPY TIME:  Radiation Exposure Index (as provided by the
fluoroscopic device): 348 microGy*m^2

[Series 1: cp_standard · 0.34mm/px · 4 of 70 frames shown (1 of 6)]
[frame 11/70]
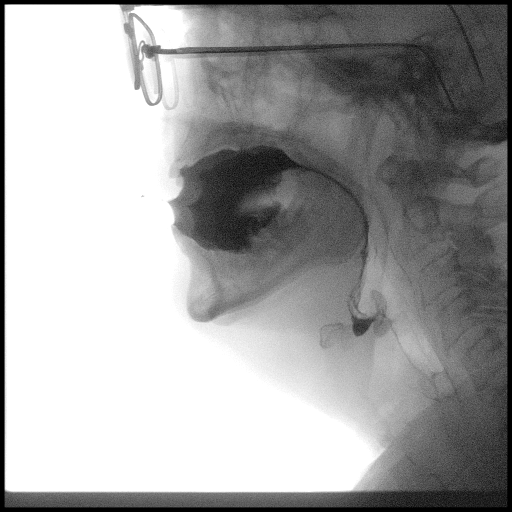
[frame 36/70]
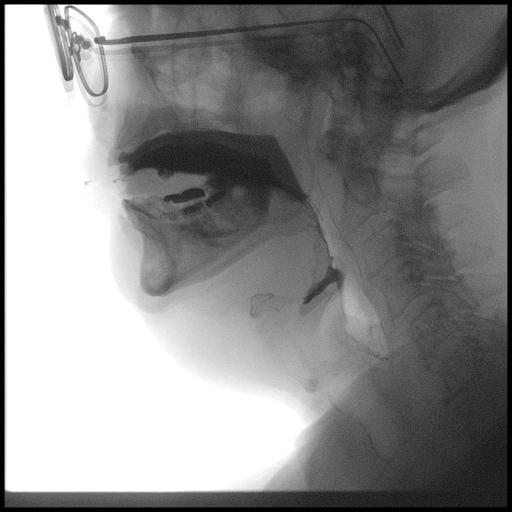
[frame 57/70]
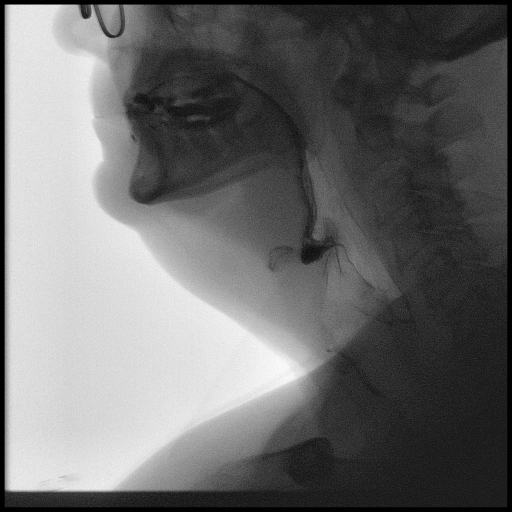
[frame 60/70]
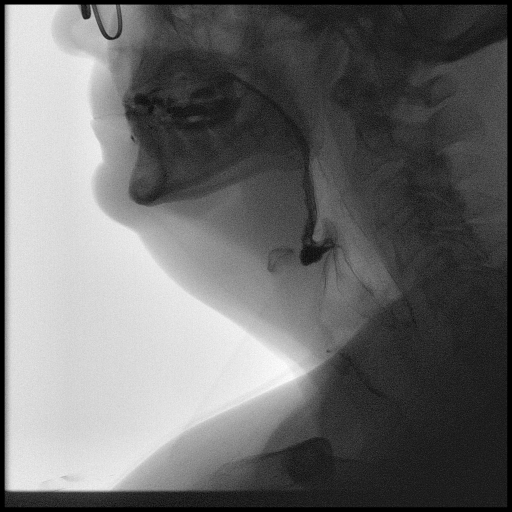

[Series 2: cp_standard · 0.34mm/px · 4 of 147 frames shown (2 of 6)]
[frame 23/147]
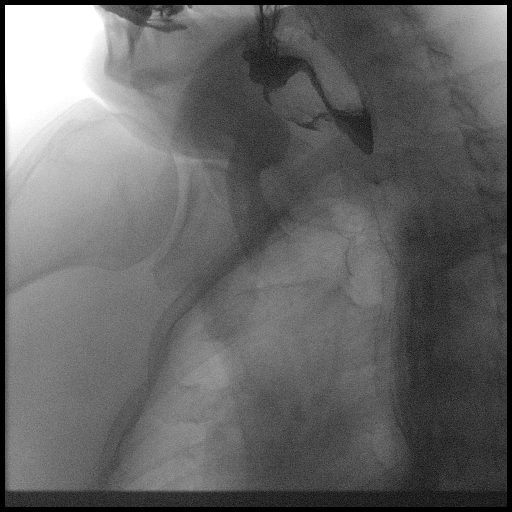
[frame 34/147]
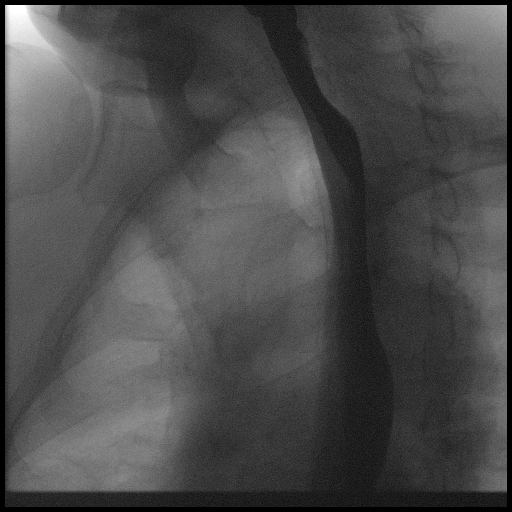
[frame 74/147]
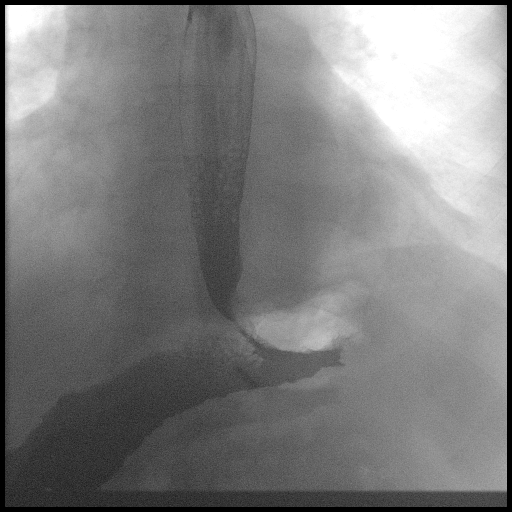
[frame 125/147]
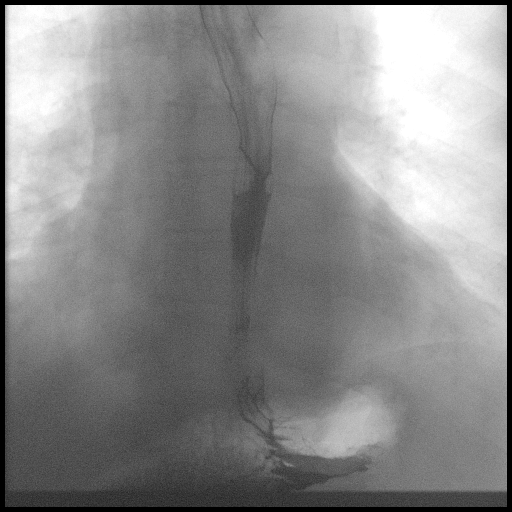

[Series 3: cp_standard · 0.35mm/px · 4 of 88 frames shown (3 of 6)]
[frame 11/88]
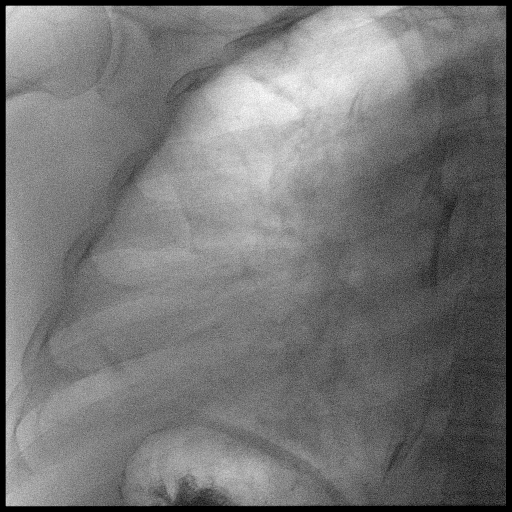
[frame 14/88]
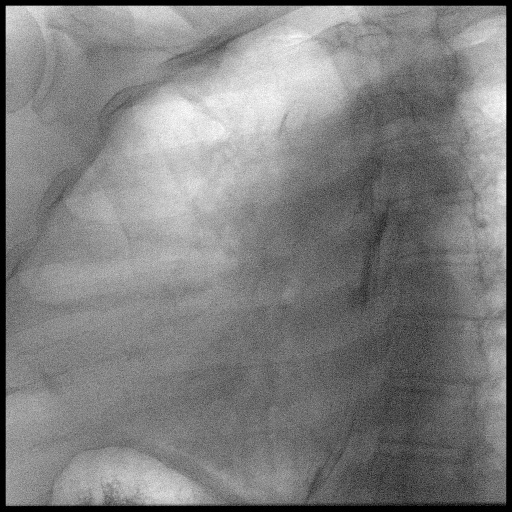
[frame 45/88]
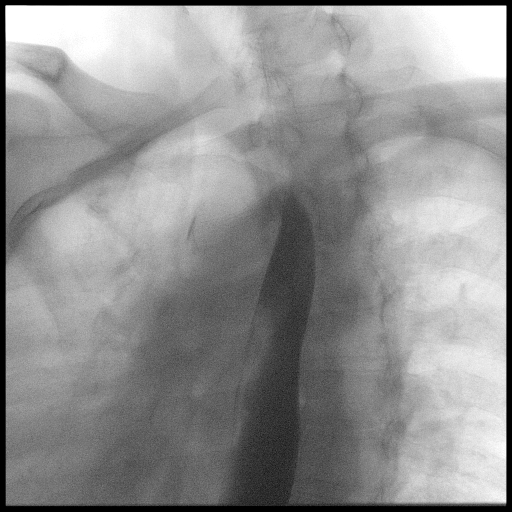
[frame 75/88]
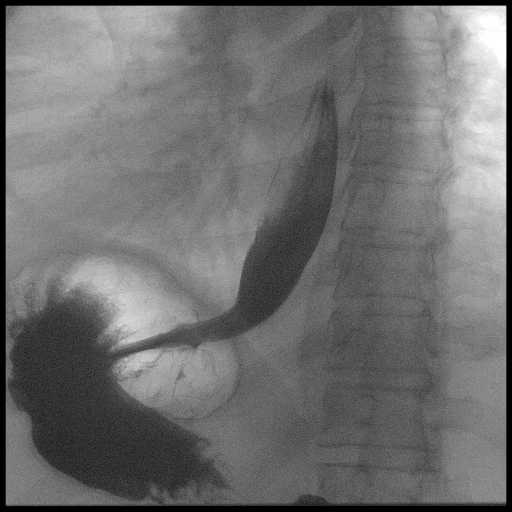

[Series 4: cp_standard · 0.35mm/px · 4 of 43 frames shown (4 of 6)]
[frame 7/43]
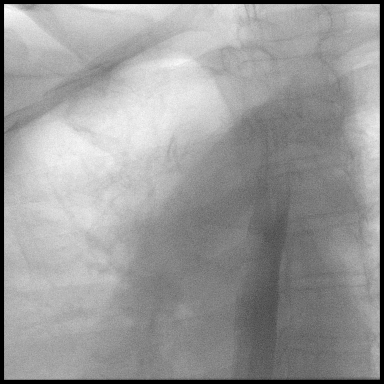
[frame 22/43]
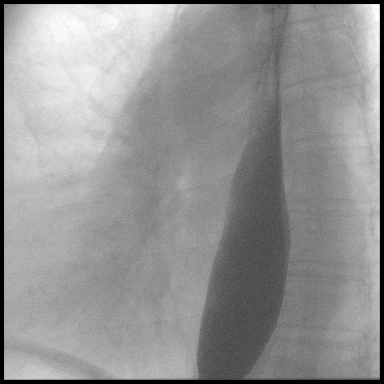
[frame 37/43]
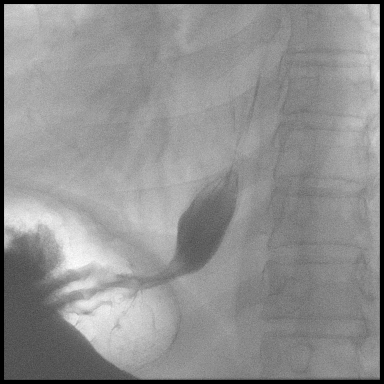
[frame 43/43]
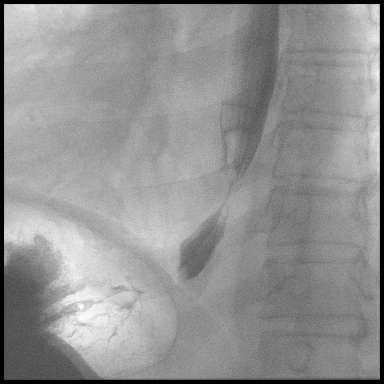

[Series 5: cp_standard · 0.35mm/px · 4 of 39 frames shown (5 of 6)]
[frame 6/39]
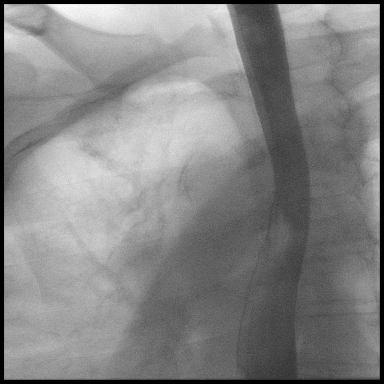
[frame 20/39]
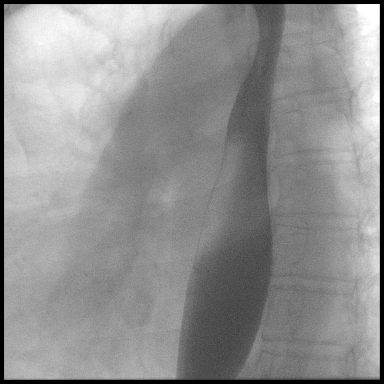
[frame 34/39]
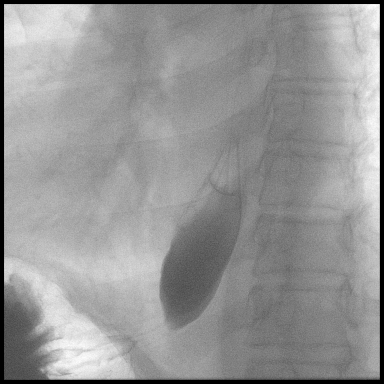
[frame 39/39]
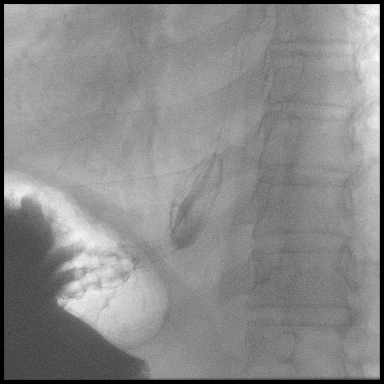

[Series 6: cp_standard · 0.35mm/px · 4 of 30 frames shown (6 of 6)]
[frame 5/30]
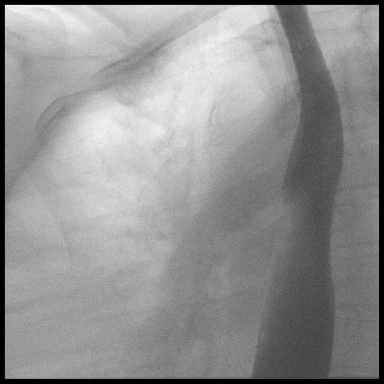
[frame 16/30]
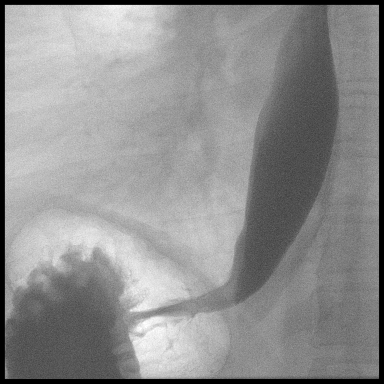
[frame 26/30]
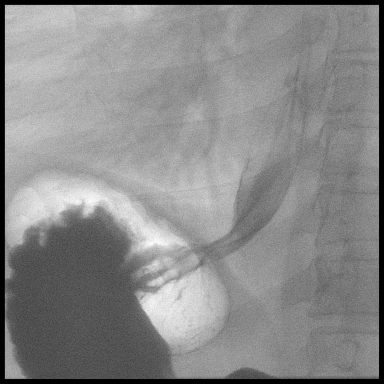
[frame 29/30]
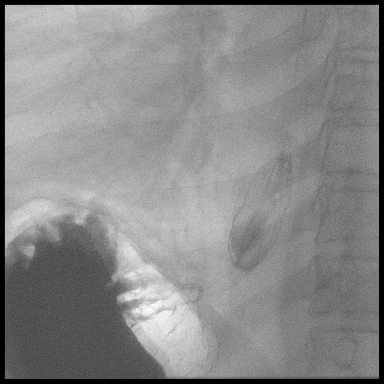

[24 of 24 positions shown; findings below may reference images not displayed]

FINDINGS: There is frank tracheal aspiration upon multiple swallows of thin
liquid, eliciting a cough. Vallecular pooling noted.

Mild smooth tapering of the distal esophagus noted. I was not able
to distend this region beyond about 14 mm. A 13 mm barium tablet
passed without difficulty into the stomach. No irregularity along
the smooth tapering of the distal esophagus identified.

Primary peristaltic waves in the esophagus were normal on [DATE]
swallows.
IMPRESSION: 1. Frank tracheal aspiration on multiple swallows of thin liquid,
eliciting a cough, speech pathology follow up assessment is
recommended.
2. Mild smooth tapering/nondistention of the distal esophagus at did
not obstruct a 13 mm barium tablet. I was not able to distend the
distal esophagus beyond about 14 mm. This could potentially
contribute to dysphagia. Endoscopy might be considered.

## 2022-01-29 ENCOUNTER — Encounter: Payer: Self-pay | Admitting: Gastroenterology
